# Patient Record
Sex: Male | Born: 1965 | Race: White | Hispanic: No | Marital: Married | Smoking: Former smoker
Health system: Southern US, Community
[De-identification: ages and names within clinical notes are randomized; demographics above are authoritative.]

## PROBLEM LIST (undated history)

## (undated) DIAGNOSIS — E559 Vitamin D deficiency, unspecified: Secondary | ICD-10-CM

## (undated) DIAGNOSIS — E039 Hypothyroidism, unspecified: Secondary | ICD-10-CM

## (undated) DIAGNOSIS — G4733 Obstructive sleep apnea (adult) (pediatric): Secondary | ICD-10-CM

## (undated) DIAGNOSIS — J45909 Unspecified asthma, uncomplicated: Secondary | ICD-10-CM

## (undated) DIAGNOSIS — R7989 Other specified abnormal findings of blood chemistry: Secondary | ICD-10-CM

## (undated) DIAGNOSIS — F32A Depression, unspecified: Secondary | ICD-10-CM

## (undated) DIAGNOSIS — E291 Testicular hypofunction: Secondary | ICD-10-CM

## (undated) DIAGNOSIS — I10 Essential (primary) hypertension: Secondary | ICD-10-CM

## (undated) DIAGNOSIS — F329 Major depressive disorder, single episode, unspecified: Secondary | ICD-10-CM

## (undated) DIAGNOSIS — E119 Type 2 diabetes mellitus without complications: Secondary | ICD-10-CM

## (undated) DIAGNOSIS — Z8719 Personal history of other diseases of the digestive system: Secondary | ICD-10-CM

## (undated) DIAGNOSIS — E669 Obesity, unspecified: Secondary | ICD-10-CM

## (undated) HISTORY — DX: Essential (primary) hypertension: I10

## (undated) HISTORY — DX: Obesity, unspecified: E66.9

## (undated) HISTORY — DX: Hypothyroidism, unspecified: E03.9

## (undated) HISTORY — DX: Personal history of other diseases of the digestive system: Z87.19

## (undated) HISTORY — DX: Vitamin D deficiency, unspecified: E55.9

## (undated) HISTORY — DX: Other specified abnormal findings of blood chemistry: R79.89

## (undated) HISTORY — DX: Depression, unspecified: F32.A

## (undated) HISTORY — PX: COLONOSCOPY: SHX174

## (undated) HISTORY — DX: Type 2 diabetes mellitus without complications: E11.9

## (undated) HISTORY — DX: Major depressive disorder, single episode, unspecified: F32.9

## (undated) HISTORY — PX: POLYPECTOMY: SHX149

## (undated) HISTORY — DX: Testicular hypofunction: E29.1

## (undated) HISTORY — DX: Obstructive sleep apnea (adult) (pediatric): G47.33

---

## 2005-06-20 ENCOUNTER — Other Ambulatory Visit: Payer: Self-pay

## 2005-06-20 ENCOUNTER — Emergency Department: Payer: Self-pay | Admitting: General Practice

## 2005-06-21 ENCOUNTER — Ambulatory Visit: Payer: Self-pay | Admitting: General Practice

## 2006-08-10 ENCOUNTER — Emergency Department: Payer: Self-pay | Admitting: Emergency Medicine

## 2006-08-10 ENCOUNTER — Other Ambulatory Visit: Payer: Self-pay

## 2008-02-24 ENCOUNTER — Emergency Department: Payer: Self-pay | Admitting: Internal Medicine

## 2008-02-24 ENCOUNTER — Other Ambulatory Visit: Payer: Self-pay

## 2008-05-24 ENCOUNTER — Other Ambulatory Visit: Payer: Self-pay

## 2008-05-24 ENCOUNTER — Inpatient Hospital Stay: Payer: Self-pay | Admitting: Internal Medicine

## 2009-03-03 ENCOUNTER — Ambulatory Visit: Payer: Self-pay | Admitting: Internal Medicine

## 2009-05-15 ENCOUNTER — Ambulatory Visit: Payer: Self-pay | Admitting: Family Medicine

## 2009-06-12 ENCOUNTER — Ambulatory Visit: Payer: Self-pay | Admitting: Family Medicine

## 2009-09-20 ENCOUNTER — Emergency Department: Payer: Self-pay | Admitting: Emergency Medicine

## 2010-07-26 ENCOUNTER — Emergency Department: Payer: Self-pay | Admitting: Emergency Medicine

## 2013-04-19 ENCOUNTER — Emergency Department: Payer: Self-pay | Admitting: Unknown Physician Specialty

## 2013-04-19 LAB — URINALYSIS, COMPLETE
Bilirubin,UR: NEGATIVE
Blood: NEGATIVE
Glucose,UR: >=500 mg/dL (ref 0–75)
Leukocyte Esterase: NEGATIVE
Nitrite: NEGATIVE
Ph: 6 (ref 4.5–8.0)
Protein: NEGATIVE
RBC,UR: 1 /HPF (ref 0–5)
Specific Gravity: 1.032 (ref 1.003–1.030)
Squamous Epithelial: NONE SEEN

## 2013-04-19 LAB — COMPREHENSIVE METABOLIC PANEL
Albumin: 3.4 g/dL (ref 3.4–5.0)
BUN: 13 mg/dL (ref 7–18)
Chloride: 102 mmol/L (ref 98–107)
Creatinine: 0.62 mg/dL (ref 0.60–1.30)
Glucose: 358 mg/dL — ABNORMAL HIGH (ref 65–99)
Potassium: 3.3 mmol/L — ABNORMAL LOW (ref 3.5–5.1)
Sodium: 133 mmol/L — ABNORMAL LOW (ref 136–145)

## 2013-04-19 LAB — CBC
HCT: 43.9 % (ref 40.0–52.0)
HGB: 15.2 g/dL (ref 13.0–18.0)
MCH: 29 pg (ref 26.0–34.0)
MCHC: 34.6 g/dL (ref 32.0–36.0)
MCV: 84 fL (ref 80–100)
Platelet: 355 10*3/uL (ref 150–440)
RDW: 13 % (ref 11.5–14.5)

## 2013-04-23 LAB — WOUND CULTURE

## 2013-08-14 ENCOUNTER — Emergency Department: Payer: Self-pay | Admitting: Emergency Medicine

## 2013-08-14 LAB — CBC
HCT: 42.9 % (ref 40.0–52.0)
HGB: 14.9 g/dL (ref 13.0–18.0)
MCH: 28.8 pg (ref 26.0–34.0)
MCV: 83 fL (ref 80–100)
Platelet: 317 10*3/uL (ref 150–440)
RBC: 5.17 10*6/uL (ref 4.40–5.90)
WBC: 13.1 10*3/uL — ABNORMAL HIGH (ref 3.8–10.6)

## 2013-08-14 LAB — BASIC METABOLIC PANEL
Anion Gap: 5 — ABNORMAL LOW (ref 7–16)
BUN: 13 mg/dL (ref 7–18)
Calcium, Total: 9.3 mg/dL (ref 8.5–10.1)
Chloride: 101 mmol/L (ref 98–107)
Creatinine: 0.82 mg/dL (ref 0.60–1.30)
EGFR (African American): >60
EGFR (Non-African Amer.): >60
Glucose: 162 mg/dL — ABNORMAL HIGH (ref 65–99)
Sodium: 136 mmol/L (ref 136–145)

## 2013-08-14 LAB — TROPONIN I
Troponin-I: <0.02 ng/mL
Troponin-I: <0.02 ng/mL

## 2013-08-14 LAB — CK TOTAL AND CKMB (NOT AT ARMC): CK, Total: 213 U/L (ref 35–232)

## 2014-01-10 ENCOUNTER — Inpatient Hospital Stay: Payer: Self-pay | Admitting: Surgery

## 2014-01-10 LAB — CBC WITH DIFFERENTIAL/PLATELET
BASOS ABS: 0.1 10*3/uL (ref 0.0–0.1)
BASOS PCT: 0.6 %
Eosinophil #: 0.1 10*3/uL (ref 0.0–0.7)
Eosinophil %: 0.5 %
HCT: 44.2 % (ref 40.0–52.0)
HGB: 14.5 g/dL (ref 13.0–18.0)
Lymphocyte #: 2.1 10*3/uL (ref 1.0–3.6)
Lymphocyte %: 9.6 %
MCH: 27.8 pg (ref 26.0–34.0)
MCHC: 32.7 g/dL (ref 32.0–36.0)
MCV: 85 fL (ref 80–100)
Monocyte #: 1.8 x10 3/mm — ABNORMAL HIGH (ref 0.2–1.0)
Monocyte %: 8.1 %
Neutrophil #: 18 10*3/uL — ABNORMAL HIGH (ref 1.4–6.5)
Neutrophil %: 81.2 %
Platelet: 299 10*3/uL (ref 150–440)
RBC: 5.2 10*6/uL (ref 4.40–5.90)
RDW: 13.7 % (ref 11.5–14.5)
WBC: 22.2 10*3/uL — ABNORMAL HIGH (ref 3.8–10.6)

## 2014-01-10 LAB — COMPREHENSIVE METABOLIC PANEL
ALBUMIN: 3.4 g/dL (ref 3.4–5.0)
AST: 12 U/L — AB (ref 15–37)
Alkaline Phosphatase: 70 U/L
Anion Gap: 2 — ABNORMAL LOW (ref 7–16)
BILIRUBIN TOTAL: 0.7 mg/dL (ref 0.2–1.0)
BUN: 10 mg/dL (ref 7–18)
CALCIUM: 9 mg/dL (ref 8.5–10.1)
Chloride: 101 mmol/L (ref 98–107)
Co2: 33 mmol/L — ABNORMAL HIGH (ref 21–32)
Creatinine: 0.82 mg/dL (ref 0.60–1.30)
EGFR (African American): >60
GLUCOSE: 127 mg/dL — AB (ref 65–99)
Osmolality: 273 (ref 275–301)
POTASSIUM: 3.3 mmol/L — AB (ref 3.5–5.1)
SGPT (ALT): 22 U/L (ref 12–78)
Sodium: 136 mmol/L (ref 136–145)
Total Protein: 7.8 g/dL (ref 6.4–8.2)

## 2014-01-10 LAB — URINALYSIS, COMPLETE
BILIRUBIN, UR: NEGATIVE
Bacteria: NONE SEEN
GLUCOSE, UR: NEGATIVE mg/dL (ref 0–75)
LEUKOCYTE ESTERASE: NEGATIVE
Nitrite: NEGATIVE
Ph: 5 (ref 4.5–8.0)
RBC,UR: 6 /HPF (ref 0–5)
Specific Gravity: 1.018 (ref 1.003–1.030)
Squamous Epithelial: NONE SEEN

## 2014-01-10 LAB — LIPASE, BLOOD: Lipase: 112 U/L (ref 73–393)

## 2014-01-11 LAB — CBC WITH DIFFERENTIAL/PLATELET
BASOS ABS: 0.1 10*3/uL (ref 0.0–0.1)
Basophil %: 0.5 %
EOS ABS: 0.4 10*3/uL (ref 0.0–0.7)
Eosinophil %: 2.6 %
HCT: 39.1 % — ABNORMAL LOW (ref 40.0–52.0)
HGB: 12.9 g/dL — AB (ref 13.0–18.0)
Lymphocyte #: 2 10*3/uL (ref 1.0–3.6)
Lymphocyte %: 12.5 %
MCH: 28 pg (ref 26.0–34.0)
MCHC: 33 g/dL (ref 32.0–36.0)
MCV: 85 fL (ref 80–100)
MONO ABS: 1.5 x10 3/mm — AB (ref 0.2–1.0)
MONOS PCT: 9.5 %
NEUTROS PCT: 74.9 %
Neutrophil #: 11.6 10*3/uL — ABNORMAL HIGH (ref 1.4–6.5)
PLATELETS: 244 10*3/uL (ref 150–440)
RBC: 4.6 10*6/uL (ref 4.40–5.90)
RDW: 13.2 % (ref 11.5–14.5)
WBC: 15.6 10*3/uL — ABNORMAL HIGH (ref 3.8–10.6)

## 2014-01-12 LAB — CBC WITH DIFFERENTIAL/PLATELET
BASOS ABS: 0.1 10*3/uL (ref 0.0–0.1)
BASOS PCT: 0.6 %
Eosinophil #: 0.5 10*3/uL (ref 0.0–0.7)
Eosinophil %: 3.9 %
HCT: 37.7 % — ABNORMAL LOW (ref 40.0–52.0)
HGB: 12.7 g/dL — ABNORMAL LOW (ref 13.0–18.0)
LYMPHS PCT: 10.9 %
Lymphocyte #: 1.5 10*3/uL (ref 1.0–3.6)
MCH: 28.4 pg (ref 26.0–34.0)
MCHC: 33.5 g/dL (ref 32.0–36.0)
MCV: 85 fL (ref 80–100)
MONO ABS: 1 x10 3/mm (ref 0.2–1.0)
MONOS PCT: 7.3 %
NEUTROS ABS: 10.6 10*3/uL — AB (ref 1.4–6.5)
NEUTROS PCT: 77.3 %
Platelet: 269 10*3/uL (ref 150–440)
RBC: 4.46 10*6/uL (ref 4.40–5.90)
RDW: 13.4 % (ref 11.5–14.5)
WBC: 13.7 10*3/uL — AB (ref 3.8–10.6)

## 2014-01-13 LAB — CBC WITH DIFFERENTIAL/PLATELET
Basophil #: 0.1 10*3/uL (ref 0.0–0.1)
Basophil %: 0.9 %
EOS PCT: 4.5 %
Eosinophil #: 0.5 10*3/uL (ref 0.0–0.7)
HCT: 41.6 % (ref 40.0–52.0)
HGB: 13.8 g/dL (ref 13.0–18.0)
LYMPHS ABS: 1.5 10*3/uL (ref 1.0–3.6)
Lymphocyte %: 13.1 %
MCH: 27.9 pg (ref 26.0–34.0)
MCHC: 33.1 g/dL (ref 32.0–36.0)
MCV: 84 fL (ref 80–100)
MONOS PCT: 7.2 %
Monocyte #: 0.8 x10 3/mm (ref 0.2–1.0)
NEUTROS PCT: 74.3 %
Neutrophil #: 8.7 10*3/uL — ABNORMAL HIGH (ref 1.4–6.5)
PLATELETS: 331 10*3/uL (ref 150–440)
RBC: 4.94 10*6/uL (ref 4.40–5.90)
RDW: 13 % (ref 11.5–14.5)
WBC: 11.7 10*3/uL — ABNORMAL HIGH (ref 3.8–10.6)

## 2014-03-03 ENCOUNTER — Ambulatory Visit: Payer: Self-pay | Admitting: Gastroenterology

## 2014-03-03 LAB — HM COLONOSCOPY: HM Colonoscopy: NEGATIVE

## 2014-08-02 ENCOUNTER — Ambulatory Visit: Payer: Self-pay | Admitting: Family Medicine

## 2014-08-30 ENCOUNTER — Ambulatory Visit: Payer: Self-pay | Admitting: Family Medicine

## 2014-10-03 ENCOUNTER — Ambulatory Visit: Payer: Self-pay | Admitting: Family Medicine

## 2014-10-30 ENCOUNTER — Ambulatory Visit: Payer: Self-pay | Admitting: Family Medicine

## 2015-04-07 ENCOUNTER — Encounter: Payer: Self-pay | Admitting: *Deleted

## 2015-04-12 ENCOUNTER — Ambulatory Visit: Payer: Self-pay | Admitting: Cardiology

## 2015-04-13 ENCOUNTER — Encounter: Payer: Self-pay | Admitting: *Deleted

## 2015-04-22 NOTE — H&P (Signed)
History of Present Illness 73 yowm who was suffering with constipation and took a laxative 3 days ago and has been having diarrhea and abdominal pain ever since. Denies fever, nausea, and vomiting, but he has been anorexic. Pain began in RLQ but is now suprapubic and bilateral, but still worse on the right.   Past History MO, hypothyroidism, dyslipidemia   Past Med/Surgical Hx:  GERD:   HTN:   Cardiac Cath 2011:   ALLERGIES:  No Known Allergies:   HOME MEDICATIONS: Medication Instructions Status  multi-vitamin 1 tab(s) orally once a day  Active  aspirin 81 mg oral tablet 1 tab(s) orally once a day  Active  amlodipine 5 mg oral tablet 1  orally once a day  Active  lisinopril 20 mg tablet 1 tab(s) orally once a day  Active  Janumet 1000 mg-50 mg oral tablet 1 tab(s) orally 2 times a day Active  Lantus  subcutaneous  Active   Family and Social History:  Family History Non-Contributory   Social History negative tobacco, negative ETOH, married, 2 kids, [employer]   Review of Systems:  Fever/Chills No   Cough No   Sputum No   Abdominal Pain Yes   Diarrhea Yes   Constipation Yes   Nausea/Vomiting No   SOB/DOE No   Chest Pain No   Dysuria No   Tolerating PT Yes   Tolerating Diet No   Medications/Allergies Reviewed Medications/Allergies reviewed   Physical Exam:  GEN well developed, well nourished, obese   HEENT pink conjunctivae, PERRL, hearing intact to voice, moist oral mucosa, Oropharynx clear   NECK supple  No masses  trachea midline   RESP normal resp effort  clear BS  no use of accessory muscles   CARD regular rate  no murmur  No LE edema  no JVD  no Rub   ABD positive tenderness  obese, moderate rebound tenderness, RLQ > LLQ   LYMPH negative neck   EXTR negative cyanosis/clubbing, negative edema   SKIN normal to palpation, skin turgor good   NEURO cranial nerves intact, follows commands, motor/sensory function  intact   PSYCH alert, A+O to time, place, person, good insight   Lab Results: Hepatic:  12-Jan-15 17:50   Bilirubin, Total 0.7  Alkaline Phosphatase 70 (45-117 NOTE: New Reference Range 11/19/13)  SGPT (ALT) 22  SGOT (AST)  12  Total Protein, Serum 7.8  Albumin, Serum 3.4  Routine Chem:  12-Jan-15 17:50   Lipase 112 (Result(s) reported on 10 Jan 2014 at 06:37PM.)  Glucose, Serum  127  BUN 10  Creatinine (comp) 0.82  Sodium, Serum 136  Potassium, Serum  3.3  Chloride, Serum 101  CO2, Serum  33  Calcium (Total), Serum 9.0  Osmolality (calc) 273  eGFR (African American) >60  eGFR (Non-African American) >60 (eGFR values <75m/min/1.73 m2 may be an indication of chronic kidney disease (CKD). Calculated eGFR is useful in patients with stable renal function. The eGFR calculation will not be reliable in acutely ill patients when serum creatinine is changing rapidly. It is not useful in  patients on dialysis. The eGFR calculation may not be applicable to patients at the low and high extremes of body sizes, pregnant women, and vegetarians.)  Anion Gap  2  Routine UA:  12-Jan-15 17:50   Color (UA) Yellow  Clarity (UA) Clear  Glucose (UA) Negative  Bilirubin (UA) Negative  Ketones (UA) 1+  Specific Gravity (UA) 1.018  Blood (UA) 1+  pH (UA) 5.0  Protein (UA) 30 mg/dL  Nitrite (UA) Negative  Leukocyte Esterase (UA) Negative (Result(s) reported on 10 Jan 2014 at 06:27PM.)  RBC (UA) 6 /HPF  WBC (UA) 2 /HPF  Bacteria (UA) NONE SEEN  Epithelial Cells (UA) NONE SEEN  Mucous (UA) PRESENT (Result(s) reported on 10 Jan 2014 at 06:27PM.)  Routine Hem:  12-Jan-15 17:50   WBC (CBC)  22.2  RBC (CBC) 5.20  Hemoglobin (CBC) 14.5  Hematocrit (CBC) 44.2  Platelet Count (CBC) 299  MCV 85  MCH 27.8  MCHC 32.7  RDW 13.7  Neutrophil % 81.2  Lymphocyte % 9.6  Monocyte % 8.1  Eosinophil % 0.5  Basophil % 0.6  Neutrophil #  18.0  Lymphocyte # 2.1  Monocyte #  1.8  Eosinophil #  0.1  Basophil # 0.1 (Result(s) reported on 10 Jan 2014 at [facility]   Radiology Results: LabUnknown:    12-Jan-15 19:37, CT Abdomen and Pelvis With Contrast  PACS Image  CT:  CT Abdomen and Pelvis With Contrast  REASON FOR EXAM:    (1) rlq pain; (2) rlq pain  COMMENTS:       PROCEDURE: CT  - CT ABDOMEN / PELVIS  W  - Jan 10 2014  7:37PM     CLINICAL DATA:  Right lower quadrant pain    EXAM:  CT ABDOMEN AND PELVIS WITH CONTRAST    TECHNIQUE:  Multidetector CTimaging of the abdomen and pelvis was performed  using the standard protocol following bolus administration of  intravenous contrast.  CONTRAST:  125 cc Isovue 370    COMPARISON:  None.    FINDINGS:  Normal appendix.    Extensive inflammatory processwithin the lower abdomen and upper  pelvis related to the sigmoid colon. There is considerable wall  thickening and edema. A few scattered diverticuli are present. There  is extensive stranding in the adjacent fat. No loculated abscess. No  extraluminal bowel gas. Findings are most consistent with advanced  acute diverticulitis without perforation or abscess formation.    Diffuse hepatic steatosis.  Gallbladder, spleen, pancreas, adrenal glands, and kidneys are  within normal limits.    No abnormal retroperitoneal adenopathy.     IMPRESSION:  Findings are most consistent with severe acute diverticulitis  without abscess or perforation. Malignancy can sometimes have a  similar appearance. Correlate clinically as for the need for  follow-up imaging.    Normal appendix.      Electronically Signed    By: Maryclare Bean M.D.    On: 01/10/2014 19:43         Verified By: Jamas Lav, M.D.,    Assessment/Admission Diagnosis Mild rectosigmoid diverticulitis (no perforation)   Plan Admit, liquid diet, IV ABx, convert to PO ABx and advance to low residue diet   Electronic Signatures: Consuela Mimes (MD)  (Signed 12-Jan-15 20:46)  Authored: CHIEF COMPLAINT and  HISTORY, PAST MEDICAL/SURGIAL HISTORY, ALLERGIES, HOME MEDICATIONS, FAMILY AND SOCIAL HISTORY, REVIEW OF SYSTEMS, PHYSICAL EXAM, LABS, Radiology, ASSESSMENT AND PLAN   Last Updated: 12-Jan-15 20:46 by Consuela Mimes (MD)

## 2015-04-22 NOTE — Discharge Summary (Signed)
PATIENT NAME:  Nicholas Cabrera, Nicholas Cabrera MR#:  [id number] DATE OF BIRTH:  1966/04/10  DATE OF ADMISSION:  01/10/2014 DATE OF DISCHARGE:  01/13/2014  PRINCIPAL DIAGNOSIS: Acute rectosigmoid diverticulitis (without perforation).   OTHER DIAGNOSES: Hypertension, diabetes, gastroesophageal reflux disease, morbid obesity, hypothyroidism, dyslipidemia, status post cardiac catheterization in 2011.   HOSPITAL COURSE: The patient was admitted to the hospital and given IV fluids and IV antibiotics, and his diet was advanced to a low-fiber diet. He was instructed in the same by the dietitian and discharged home on oral antibiotics on January 15th. He was asked to make an appointment to see Dr. Rexene Edison in followup.    ____________________________ Consuela Mimes, MD wfm:gb D: 01/21/2014 22:58:25 ET T: 01/21/2014 23:14:06 ET JOB#: [id number]  cc: Consuela Mimes, MD, <Dictator> Consuela Mimes MD ELECTRONICALLY SIGNED 01/23/2014 19:50

## 2015-05-04 ENCOUNTER — Ambulatory Visit: Payer: Self-pay | Admitting: Endocrinology

## 2015-06-05 ENCOUNTER — Telehealth: Payer: Self-pay | Admitting: Family Medicine

## 2015-06-05 MED ORDER — INSULIN GLARGINE 100 UNIT/ML ~~LOC~~ SOLN: 40.0000 [IU] | Freq: Every day | SUBCUTANEOUS | Status: DC

## 2015-06-05 NOTE — Telephone Encounter (Signed)
noted 

## 2015-06-05 NOTE — Telephone Encounter (Signed)
Pt's wife here, asked for refill for pt Lantus.  [place].

## 2015-06-13 DIAGNOSIS — E669 Obesity, unspecified: Secondary | ICD-10-CM | POA: Insufficient documentation

## 2015-06-13 DIAGNOSIS — F329 Major depressive disorder, single episode, unspecified: Secondary | ICD-10-CM | POA: Insufficient documentation

## 2015-06-13 DIAGNOSIS — E785 Hyperlipidemia, unspecified: Secondary | ICD-10-CM | POA: Insufficient documentation

## 2015-06-13 DIAGNOSIS — IMO0002 Reserved for concepts with insufficient information to code with codable children: Secondary | ICD-10-CM | POA: Insufficient documentation

## 2015-06-13 DIAGNOSIS — E1165 Type 2 diabetes mellitus with hyperglycemia: Secondary | ICD-10-CM | POA: Insufficient documentation

## 2015-06-13 DIAGNOSIS — E039 Hypothyroidism, unspecified: Secondary | ICD-10-CM | POA: Insufficient documentation

## 2015-06-13 DIAGNOSIS — F32A Depression, unspecified: Secondary | ICD-10-CM | POA: Insufficient documentation

## 2015-06-13 DIAGNOSIS — I10 Essential (primary) hypertension: Secondary | ICD-10-CM | POA: Insufficient documentation

## 2015-06-19 ENCOUNTER — Ambulatory Visit: Payer: Self-pay | Admitting: Family Medicine

## 2015-06-19 ENCOUNTER — Telehealth: Payer: Self-pay | Admitting: Family Medicine

## 2015-06-19 NOTE — Telephone Encounter (Signed)
E-Fax xame through for refill: Rx: Blood glucose strp/Blood glucose test strips

## 2015-06-19 NOTE — Telephone Encounter (Signed)
E-Fax came through for refill: Rx: Accu-chek aviva/Blood glucose monitoring kit Rx copy in basket

## 2015-06-19 NOTE — Telephone Encounter (Signed)
Per practice partner chart, we have already filled glucose test strips thru a different agency earlier this year.

## 2015-06-19 NOTE — Telephone Encounter (Signed)
Duplicate request

## 2015-06-19 NOTE — Telephone Encounter (Signed)
E-Fax came through for refill on: Rx: Blood Glucose Test Strips Rx in basket ready for pick up

## 2015-06-29 ENCOUNTER — Ambulatory Visit (INDEPENDENT_AMBULATORY_CARE_PROVIDER_SITE_OTHER): Payer: BC Managed Care – PPO | Admitting: Family Medicine

## 2015-06-29 ENCOUNTER — Encounter: Payer: Self-pay | Admitting: Family Medicine

## 2015-06-29 VITALS — BP 151/99 | HR 92 | Temp 98.8°F | Wt 278.0 lb

## 2015-06-29 DIAGNOSIS — E1165 Type 2 diabetes mellitus with hyperglycemia: Secondary | ICD-10-CM | POA: Diagnosis not present

## 2015-06-29 DIAGNOSIS — E039 Hypothyroidism, unspecified: Secondary | ICD-10-CM | POA: Diagnosis not present

## 2015-06-29 DIAGNOSIS — E669 Obesity, unspecified: Secondary | ICD-10-CM | POA: Diagnosis not present

## 2015-06-29 DIAGNOSIS — H538 Other visual disturbances: Secondary | ICD-10-CM

## 2015-06-29 DIAGNOSIS — I10 Essential (primary) hypertension: Secondary | ICD-10-CM | POA: Diagnosis not present

## 2015-06-29 DIAGNOSIS — E538 Deficiency of other specified B group vitamins: Secondary | ICD-10-CM

## 2015-06-29 DIAGNOSIS — G4733 Obstructive sleep apnea (adult) (pediatric): Secondary | ICD-10-CM | POA: Diagnosis not present

## 2015-06-29 DIAGNOSIS — E785 Hyperlipidemia, unspecified: Secondary | ICD-10-CM | POA: Diagnosis not present

## 2015-06-29 DIAGNOSIS — IMO0002 Reserved for concepts with insufficient information to code with codable children: Secondary | ICD-10-CM

## 2015-06-29 DIAGNOSIS — E559 Vitamin D deficiency, unspecified: Secondary | ICD-10-CM

## 2015-06-29 MED ORDER — CANAGLIFLOZIN 100 MG PO TABS: 100.0000 mg | ORAL_TABLET | Freq: Every day | ORAL | Status: DC

## 2015-06-29 MED ORDER — SITAGLIPTIN PHOS-METFORMIN HCL 50-1000 MG PO TABS: 1.0000 | ORAL_TABLET | Freq: Two times a day (BID) | ORAL | Status: DC

## 2015-06-29 MED ORDER — AMLODIPINE BESYLATE 10 MG PO TABS: 10.0000 mg | ORAL_TABLET | Freq: Every day | ORAL | Status: DC

## 2015-06-29 MED ORDER — CHLORTHALIDONE 25 MG PO TABS: 25.0000 mg | ORAL_TABLET | Freq: Every day | ORAL | Status: DC

## 2015-06-29 MED ORDER — FLUOXETINE HCL 20 MG PO TABS: 20.0000 mg | ORAL_TABLET | Freq: Every day | ORAL | Status: DC

## 2015-06-29 MED ORDER — INSULIN PEN NEEDLE 32G X 4 MM MISC: Status: DC

## 2015-06-29 MED ORDER — METOPROLOL SUCCINATE ER 50 MG PO TB24: 50.0000 mg | ORAL_TABLET | Freq: Every day | ORAL | Status: DC

## 2015-06-29 MED ORDER — LISINOPRIL 40 MG PO TABS: 40.0000 mg | ORAL_TABLET | Freq: Every day | ORAL | Status: DC

## 2015-06-29 MED ORDER — LEVOTHYROXINE SODIUM 75 MCG PO TABS: 75.0000 ug | ORAL_TABLET | Freq: Every day | ORAL | Status: DC

## 2015-06-29 MED ORDER — INSULIN GLARGINE 100 UNIT/ML SOLOSTAR PEN: 40.0000 [IU] | PEN_INJECTOR | Freq: Every day | SUBCUTANEOUS | Status: DC

## 2015-06-29 MED ORDER — INSULIN GLARGINE 100 UNIT/ML ~~LOC~~ SOLN: 40.0000 [IU] | Freq: Every day | SUBCUTANEOUS | Status: DC

## 2015-06-29 NOTE — Assessment & Plan Note (Addendum)
Maintaining, not losing; this is such a huge factor in his health; if he could lose significant weight, the diabetes, sleep apnea, hypertension would likely all improve immensely; check TSH today

## 2015-06-29 NOTE — Assessment & Plan Note (Signed)
Check TSH and adjust if needed 

## 2015-06-29 NOTE — Progress Notes (Signed)
BP 151/99 mmHg  Pulse 92  Temp(Src) 98.8 F (37.1 C)  Wt 278 lb (126.1 kg)  SpO2 96%   Subjective:    Patient ID: Nicholas Cabrera, male    DOB: 11-16-1966, 49 y.o.   MRN: [id number]  HPI: Nicholas Cabrera is a 49 y.o. male  Chief Complaint  Patient presents with  . Diabetes    needs refill on pen needles  . Hypertension   Hypertension Patient has had hypertension for years Checking blood pressure away from here?  yes How often? Varies, over a month since he checked it last Works 12 hours a day, lifestyle is difficult for him to get any rest, hard to eat right, etc. Range (low to high) over last two weeks:  Not checked, previously 148-179 on top, bottom number 88-110 If taking medicines, are you taking them regularly?  NO Siblings / family history: Does high blood pressure run in your family?   YES Salt:  Trying to limit sodium / salt when buying foods at the grocery store?  NO buys what he can afford Do you try to limit added salt when cooking and at the table?  yes Sweets/licorice:  Do you eat a lot of sweets or eat black licorice?  no Saturated fats: Do you eat a lot of foods like bacon, sausage, pepperoni, cheeseburgers, hot dogs, bologna, and cheese?  YES Steroids/Non-steroidals:  Have you had a recent cortisone shot in the last few months?  no  Do you take prednisone or prescription NSAIDs, No, or take OTCS NSAIDs such as ibuprofen, Motrin, Advil, Aleve, or naproxen? no Smoking: Do you smoke?  no Snoring / sleep apnea: Do you snore or have sleep apnea?  YES  If diagnosed with sleep apnea, do you use your machine?  NO Stress: Do you feel like you are under excessive stress or that your stress level affects your blood pressure at times?    YES Stroh's (alcohol): Do you drink alcohol  no  Sudafed (decongestants): Do you use any OTC decongestant products like Allegra-D, Claritin-D, Zyrtec-D, Tylenol Cold and Sinus, etc.?  no  Diabetes mellitus; FSBS usually 160-230; he is  using insulin 40 units once a day right before bedtime; would be willing to see endocrinologist if price was not an option; he has filed for bankruptcy (see below); he admits that he works long hours and does not have time or resources to eat right or be active; he has not seen an eye doctor since 2007  He and I talked about recent issues with compliance; he filed for bankruptcy, and there are significant financial stressors, obviously, which hinder him taking his medicines regularly; he is also under stress from having to come in to the office and have labs and is trying to be cost-conscience  Relevant past medical, surgical, family and social history reviewed and updated as indicated. Interim medical history since our last visit reviewed. Allergies and medications reviewed and updated.  Review of Systems  Eyes:       Some pressure around eyes; blurred vision left eye, last eye exam 2007, Dr. Ellin Mayhew  Per HPI unless specifically indicated above  Current Outpatient Prescriptions on File Prior to Visit  Medication Sig Dispense Refill  . aspirin 81 MG tablet Take 81 mg by mouth daily.    Marland Kitchen BAYER CONTOUR TEST test strip      No current facility-administered medications on file prior to visit.        Objective:  BP 151/99 mmHg  Pulse 92  Temp(Src) 98.8 F (37.1 C)  Wt 278 lb (126.1 kg)  SpO2 96%  Wt Readings from Last 3 Encounters:  06/29/15 278 lb (126.1 kg)  04/12/15 273 lb (123.832 kg)    Physical Exam  Constitutional: He appears well-developed and well-nourished. No distress.  HENT:  Head: Normocephalic and atraumatic.  Nose: No rhinorrhea.  Mouth/Throat: Mucous membranes are normal.  Eyes: EOM are normal. No scleral icterus.  Neck: No JVD present.  Cardiovascular: Normal rate, regular rhythm and normal heart sounds.   No murmur heard. Pulmonary/Chest: Effort normal and breath sounds normal. No respiratory distress. He has no wheezes.  Abdominal: Soft. Bowel sounds are  normal. He exhibits no distension. There is no tenderness.  Musculoskeletal: He exhibits no edema.  Neurological: He is alert. He displays no tremor. He exhibits normal muscle tone.  Skin: Skin is warm and dry. No rash noted. He is not diaphoretic. No cyanosis.  Psychiatric: He has a normal mood and affect. His behavior is normal. Judgment and thought content normal.  Nursing note and vitals reviewed.  Diabetic Foot Form - Detailed   Diabetic Foot Exam - detailed  Diabetic Foot exam was performed with the following findings:  Yes 06/29/2015  3:29 PM  Visual Foot Exam completed.:  Yes  Is there a history of foot ulcer?:  No  Can the patient see the bottom of their feet?:  No  Are the shoes appropriate in style and fit?:  No    Pulse Foot Exam completed.:  Yes  Right Dorsalis Pedis:  Present Left Dorsalis Pedis:  Present  Sensory Foot Exam Completed.:  Yes  Semmes-Weinstein Monofilament Test  R Site 1-Great Toe:  Pos L Site 1-Great Toe:  Pos    Comments:  Intact to monofilament and vibration testing        Assessment & Plan:   Problem List Items Addressed This Visit      Cardiovascular and Mediastinum   Hypertension (Chronic)    Start back on medicines, move 3 pills to lunch time and 1 pill to bedtime      Relevant Medications   lisinopril (PRINIVIL,ZESTRIL) 40 MG tablet   amLODipine (NORVASC) 10 MG tablet   chlorthalidone (HYGROTON) 25 MG tablet   metoprolol succinate (TOPROL-XL) 50 MG 24 hr tablet   Other Relevant Orders   Basic metabolic panel (Completed)   Microalbumin / creatinine urine ratio     Respiratory   Obstructive sleep apnea    I urged the patient to try to get back on his CPAP; untreated sleep apnea can be contributing to other health problems like his hypertension and obesity; I urged him to call around and check with different companies if needed for price shopping; I am here to help, so I will write for any sort of equipment he might need         Endocrine   Hypothyroidism (Chronic)    Check TSH and adjust if needed      Relevant Medications   metoprolol succinate (TOPROL-XL) 50 MG 24 hr tablet   levothyroxine (SYNTHROID, LEVOTHROID) 75 MCG tablet   Other Relevant Orders   TSH (Completed)     Other   Type 2 diabetes mellitus, uncontrolled - Primary (Chronic)    Check A1C; stressed to get in to see eye doctor; check sugars 3x a day, but okay to do just 2x a day for cost if best for patient; continue aspirin; foot exam by MD; continue  insulin      Relevant Medications   lisinopril (PRINIVIL,ZESTRIL) 40 MG tablet   canagliflozin (INVOKANA) 100 MG TABS tablet   sitaGLIPtin-metformin (JANUMET) 50-1000 MG per tablet   Insulin Glargine (LANTUS SOLOSTAR) 100 UNIT/ML Solostar Pen   Other Relevant Orders   Basic metabolic panel (Completed)   Hgb A1c w/o eAG (Completed)   Microalbumin / creatinine urine ratio   Hyperlipidemia (Chronic)    Start low dose atorvastatin, last LDL was 79 and last HDL was 40      Relevant Medications   lisinopril (PRINIVIL,ZESTRIL) 40 MG tablet   amLODipine (NORVASC) 10 MG tablet   chlorthalidone (HYGROTON) 25 MG tablet   metoprolol succinate (TOPROL-XL) 50 MG 24 hr tablet   Obesity (Chronic)    Maintaining, not losing; this is such a huge factor in his health; if he could lose significant weight, the diabetes, sleep apnea, hypertension would likely all improve immensely; check TSH today      Relevant Medications   canagliflozin (INVOKANA) 100 MG TABS tablet   sitaGLIPtin-metformin (JANUMET) 50-1000 MG per tablet   Insulin Glargine (LANTUS SOLOSTAR) 100 UNIT/ML Solostar Pen    Other Visit Diagnoses    Blurred vision        stressed the absolute importance of having him see his eye doctor right away; not only diabetes but HTN could be causing damage to blood vessels in back of eye      He had a previous phone conversation with staff in May and was going to be discharged from the practice; the  letter never went out, and now that I appreciate where he is coming from, I told him I hear his side of things, and explained our side of things as well; his will NOT be terminated I told him; we came to an understanding, with me realizing the financial constraints, time constraints, and his limited perceived ability to eat right and find time to exercise; he understands my desire to get his numbers to goal, monitor his sugars, kidneys, liver, have him see an eye doctor, etc. in hopes of getting his multiple disease states under control and he'll try to work with me in coming in to appointments; I stressed that I am not asking him to come in for labs or visits just to bring him in, but that we need to work together; once he is in better health, we can conceivably see each other every 6 months, but I can't turn him loose in his current state of health for 6 months Follow up plan: Return in about 3 months (around 09/29/2015) for diabetes.  Orders Placed This Encounter  Procedures  . Basic metabolic panel  . Hgb A1c w/o eAG  . Microalbumin / creatinine urine ratio  . TSH  . Microalbumin, Urine Waived

## 2015-06-29 NOTE — Assessment & Plan Note (Signed)
Start back on medicines, move 3 pills to lunch time and 1 pill to bedtime

## 2015-06-29 NOTE — Patient Instructions (Addendum)
[facility] might be an option for you Your goal blood pressure is less than  130/85. Try to follow the DASH guidelines (DASH stands for Dietary Approaches to Stop Hypertension) Try to limit the sodium in your diet.  Ideally, consume less than 1.5 grams (less than 1,500mg ) per day. Do not add salt when cooking or at the table.  Check the sodium amount on labels when shopping, and choose items lower in sodium when given a choice. Avoid or limit foods that already contain a lot of sodium. Eat a diet rich in fruits and vegetables and whole grains. Do call Dr. Ellin Mayhew and schedule an appointment to see him just as soon as you can Do NOT drive if your vision is impaired or you have trouble seeing  Take the chlorthalidone in the mornings, late morning or around lunch time The other medicines you can either take late morning or lunch or the evening, but make sure you take each type of medicine at the same time of day  Return for follow-up in 3 months We are here to help you, so please contact me if there is anything we can do for you

## 2015-06-29 NOTE — Assessment & Plan Note (Signed)
Check A1C; stressed to get in to see eye doctor; check sugars 3x a day, but okay to do just 2x a day for cost if best for patient; continue aspirin; foot exam by MD; continue insulin

## 2015-06-29 NOTE — Assessment & Plan Note (Signed)
Start low dose atorvastatin, last LDL was 79 and last HDL was 40

## 2015-06-30 LAB — MICROALBUMIN, URINE WAIVED
CREATININE, URINE WAIVED: 200 mg/dL (ref 10–300)
MICROALB, UR WAIVED: 30 mg/L — AB (ref 0–19)
Microalb/Creat Ratio: <30 mg/g (ref <30)

## 2015-06-30 LAB — BASIC METABOLIC PANEL
BUN/Creatinine Ratio: 14 (ref 9–20)
BUN: 8 mg/dL (ref 6–24)
CO2: 25 mmol/L (ref 18–29)
CREATININE: 0.58 mg/dL — AB (ref 0.76–1.27)
Calcium: 9.2 mg/dL (ref 8.7–10.2)
Chloride: 98 mmol/L (ref 97–108)
GFR, EST AFRICAN AMERICAN: 140 mL/min/{1.73_m2} (ref >59)
GFR, EST NON AFRICAN AMERICAN: 121 mL/min/{1.73_m2} (ref >59)
Glucose: 233 mg/dL — ABNORMAL HIGH (ref 65–99)
POTASSIUM: 4 mmol/L (ref 3.5–5.2)
Sodium: 140 mmol/L (ref 134–144)

## 2015-06-30 LAB — HGB A1C W/O EAG: HEMOGLOBIN A1C: 8.4 % — AB (ref 4.8–5.6)

## 2015-06-30 LAB — TSH: TSH: 2.91 u[IU]/mL (ref 0.450–4.500)

## 2015-07-01 DIAGNOSIS — G4733 Obstructive sleep apnea (adult) (pediatric): Secondary | ICD-10-CM | POA: Insufficient documentation

## 2015-07-01 NOTE — Assessment & Plan Note (Signed)
I urged the patient to try to get back on his CPAP; untreated sleep apnea can be contributing to other health problems like his hypertension and obesity; I urged him to call around and check with different companies if needed for price shopping; I am here to help, so I will write for any sort of equipment he might need

## 2015-07-07 ENCOUNTER — Other Ambulatory Visit: Payer: Self-pay | Admitting: Family Medicine

## 2015-07-07 MED ORDER — BAYER CONTOUR TEST VI STRP: ORAL_STRIP | Status: DC

## 2015-07-07 NOTE — Telephone Encounter (Signed)
I spoke with pharmacy, they have been providing his glucose test strips for several years now. Per practice partner, you last wrote a 3 month supply back in April. He does need a refill on test strips.

## 2015-07-07 NOTE — Telephone Encounter (Signed)
That's fine to refill; I entered the sig; different pharmacy listed, so I'm printing to be manually faxed

## 2015-07-07 NOTE — Telephone Encounter (Signed)
rx faxed

## 2015-07-07 NOTE — Telephone Encounter (Signed)
[facility] called regarding a denial to Rx BAYER CONTOUR TEST test strip And want to know what was the reason and would like to talk to Dr. Sanda Klein, there number is [phone]

## 2015-08-11 ENCOUNTER — Encounter: Payer: Self-pay | Admitting: Emergency Medicine

## 2015-08-11 DIAGNOSIS — E860 Dehydration: Secondary | ICD-10-CM | POA: Insufficient documentation

## 2015-08-11 DIAGNOSIS — R110 Nausea: Secondary | ICD-10-CM | POA: Diagnosis not present

## 2015-08-11 DIAGNOSIS — E291 Testicular hypofunction: Secondary | ICD-10-CM | POA: Insufficient documentation

## 2015-08-11 DIAGNOSIS — K5730 Diverticulosis of large intestine without perforation or abscess without bleeding: Secondary | ICD-10-CM | POA: Diagnosis not present

## 2015-08-11 DIAGNOSIS — E559 Vitamin D deficiency, unspecified: Secondary | ICD-10-CM | POA: Insufficient documentation

## 2015-08-11 DIAGNOSIS — A419 Sepsis, unspecified organism: Secondary | ICD-10-CM | POA: Diagnosis not present

## 2015-08-11 DIAGNOSIS — I10 Essential (primary) hypertension: Secondary | ICD-10-CM | POA: Insufficient documentation

## 2015-08-11 DIAGNOSIS — F329 Major depressive disorder, single episode, unspecified: Secondary | ICD-10-CM | POA: Diagnosis not present

## 2015-08-11 DIAGNOSIS — Z9889 Other specified postprocedural states: Secondary | ICD-10-CM | POA: Insufficient documentation

## 2015-08-11 DIAGNOSIS — R1030 Lower abdominal pain, unspecified: Secondary | ICD-10-CM | POA: Insufficient documentation

## 2015-08-11 DIAGNOSIS — K5732 Diverticulitis of large intestine without perforation or abscess without bleeding: Principal | ICD-10-CM | POA: Insufficient documentation

## 2015-08-11 DIAGNOSIS — Z87891 Personal history of nicotine dependence: Secondary | ICD-10-CM | POA: Diagnosis not present

## 2015-08-11 DIAGNOSIS — E669 Obesity, unspecified: Secondary | ICD-10-CM | POA: Insufficient documentation

## 2015-08-11 DIAGNOSIS — N200 Calculus of kidney: Secondary | ICD-10-CM | POA: Diagnosis not present

## 2015-08-11 DIAGNOSIS — E039 Hypothyroidism, unspecified: Secondary | ICD-10-CM | POA: Diagnosis not present

## 2015-08-11 DIAGNOSIS — G4730 Sleep apnea, unspecified: Secondary | ICD-10-CM | POA: Diagnosis not present

## 2015-08-11 DIAGNOSIS — G4733 Obstructive sleep apnea (adult) (pediatric): Secondary | ICD-10-CM | POA: Diagnosis not present

## 2015-08-11 DIAGNOSIS — J45909 Unspecified asthma, uncomplicated: Secondary | ICD-10-CM | POA: Diagnosis not present

## 2015-08-11 DIAGNOSIS — R1032 Left lower quadrant pain: Secondary | ICD-10-CM | POA: Diagnosis present

## 2015-08-11 DIAGNOSIS — E119 Type 2 diabetes mellitus without complications: Secondary | ICD-10-CM | POA: Insufficient documentation

## 2015-08-11 LAB — COMPREHENSIVE METABOLIC PANEL
ALBUMIN: 4 g/dL (ref 3.5–5.0)
ALK PHOS: 52 U/L (ref 38–126)
ALT: 25 U/L (ref 17–63)
ANION GAP: 9 (ref 5–15)
AST: 26 U/L (ref 15–41)
BUN: 8 mg/dL (ref 6–20)
CALCIUM: 9.2 mg/dL (ref 8.9–10.3)
CO2: 28 mmol/L (ref 22–32)
CREATININE: 0.63 mg/dL (ref 0.61–1.24)
Chloride: 101 mmol/L (ref 101–111)
GFR calc Af Amer: >60 mL/min (ref >60)
GFR calc non Af Amer: >60 mL/min (ref >60)
Glucose, Bld: 178 mg/dL — ABNORMAL HIGH (ref 65–99)
POTASSIUM: 3.5 mmol/L (ref 3.5–5.1)
Sodium: 138 mmol/L (ref 135–145)
TOTAL PROTEIN: 7.3 g/dL (ref 6.5–8.1)
Total Bilirubin: 0.7 mg/dL (ref 0.3–1.2)

## 2015-08-11 LAB — CBC
HCT: 46.2 % (ref 40.0–52.0)
Hemoglobin: 15.6 g/dL (ref 13.0–18.0)
MCH: 28.5 pg (ref 26.0–34.0)
MCHC: 33.8 g/dL (ref 32.0–36.0)
MCV: 84.5 fL (ref 80.0–100.0)
PLATELETS: 259 10*3/uL (ref 150–440)
RBC: 5.46 MIL/uL (ref 4.40–5.90)
RDW: 13.5 % (ref 11.5–14.5)
WBC: 17.2 10*3/uL — AB (ref 3.8–10.6)

## 2015-08-11 LAB — GLUCOSE, CAPILLARY: GLUCOSE-CAPILLARY: 166 mg/dL — AB (ref 65–99)

## 2015-08-11 LAB — LIPASE, BLOOD: Lipase: 30 U/L (ref 22–51)

## 2015-08-11 NOTE — ED Notes (Signed)
Patient states that he developed lower abd pain and pressure around 10:00am. Patient states that he has not been able to urinate since 08:00am. Patient bladder scan 77ml. Patient states that he has a history of diverticulitis. Patient states that he had some nausea earlier today but denies at this time. Patient reports poor po intake today.

## 2015-08-12 ENCOUNTER — Emergency Department: Payer: BC Managed Care – PPO

## 2015-08-12 ENCOUNTER — Observation Stay
Admission: EM | Admit: 2015-08-12 | Discharge: 2015-08-13 | Disposition: A | Discharge status: Home / Self Care | Payer: BC Managed Care – PPO | Attending: Internal Medicine | Admitting: Internal Medicine

## 2015-08-12 DIAGNOSIS — K5792 Diverticulitis of intestine, part unspecified, without perforation or abscess without bleeding: Secondary | ICD-10-CM | POA: Diagnosis present

## 2015-08-12 DIAGNOSIS — R1032 Left lower quadrant pain: Secondary | ICD-10-CM

## 2015-08-12 DIAGNOSIS — K5732 Diverticulitis of large intestine without perforation or abscess without bleeding: Secondary | ICD-10-CM

## 2015-08-12 HISTORY — DX: Unspecified asthma, uncomplicated: J45.909

## 2015-08-12 LAB — URINALYSIS COMPLETE WITH MICROSCOPIC (ARMC ONLY)
Bilirubin Urine: NEGATIVE
Glucose, UA: NEGATIVE mg/dL
Ketones, ur: NEGATIVE mg/dL
Leukocytes, UA: NEGATIVE
NITRITE: NEGATIVE
Protein, ur: 30 mg/dL — AB
Specific Gravity, Urine: 1.027 (ref 1.005–1.030)
pH: 5 (ref 5.0–8.0)

## 2015-08-12 LAB — GLUCOSE, CAPILLARY
Glucose-Capillary: 180 mg/dL — ABNORMAL HIGH (ref 65–99)
Glucose-Capillary: 160 mg/dL — ABNORMAL HIGH (ref 65–99)
Glucose-Capillary: 176 mg/dL — ABNORMAL HIGH (ref 65–99)

## 2015-08-12 LAB — TSH: TSH: 5.351 u[IU]/mL — ABNORMAL HIGH (ref 0.350–4.500)

## 2015-08-12 LAB — HEMOGLOBIN A1C: Hgb A1c MFr Bld: 7.9 % — ABNORMAL HIGH (ref 4.0–6.0)

## 2015-08-12 MED ORDER — METRONIDAZOLE 500 MG PO TABS
500.0000 mg | ORAL_TABLET | Freq: Three times a day (TID) | ORAL | Status: DC
  Administered 2015-08-12 – 2015-08-13 (×3): 500 mg via ORAL
  Filled 2015-08-12 (×3): qty 1

## 2015-08-12 MED ORDER — ASPIRIN 81 MG PO TABS: 81.0000 mg | ORAL_TABLET | Freq: Every day | ORAL | Status: DC

## 2015-08-12 MED ORDER — ASPIRIN EC 81 MG PO TBEC
81.0000 mg | DELAYED_RELEASE_TABLET | Freq: Every day | ORAL | Status: DC
  Administered 2015-08-12 – 2015-08-13 (×2): 81 mg via ORAL
  Filled 2015-08-12 (×2): qty 1

## 2015-08-12 MED ORDER — VITAMIN B-12 1000 MCG PO TABS
1500.0000 ug | ORAL_TABLET | Freq: Every day | ORAL | Status: DC
  Administered 2015-08-12 – 2015-08-13 (×2): 1500 ug via ORAL
  Filled 2015-08-12 (×2): qty 1

## 2015-08-12 MED ORDER — VITAMIN D 50 MCG (2000 UT) PO CAPS: 2000.0000 [IU] | ORAL_CAPSULE | Freq: Every day | ORAL | Status: DC

## 2015-08-12 MED ORDER — FLUOXETINE HCL 20 MG PO TABS
20.0000 mg | ORAL_TABLET | Freq: Every day | ORAL | Status: DC
  Administered 2015-08-12 – 2015-08-13 (×2): 20 mg via ORAL
  Filled 2015-08-12 (×4): qty 1

## 2015-08-12 MED ORDER — CHLORTHALIDONE 25 MG PO TABS
25.0000 mg | ORAL_TABLET | Freq: Every day | ORAL | Status: DC
  Administered 2015-08-12 – 2015-08-13 (×2): 25 mg via ORAL
  Filled 2015-08-12 (×2): qty 1

## 2015-08-12 MED ORDER — DOCUSATE SODIUM 100 MG PO CAPS
100.0000 mg | ORAL_CAPSULE | Freq: Two times a day (BID) | ORAL | Status: DC
  Administered 2015-08-12 – 2015-08-13 (×3): 100 mg via ORAL
  Filled 2015-08-12 (×3): qty 1

## 2015-08-12 MED ORDER — MORPHINE SULFATE 2 MG/ML IJ SOLN: 2.0000 mg | INTRAMUSCULAR | Status: DC | PRN

## 2015-08-12 MED ORDER — ACETAMINOPHEN 650 MG RE SUPP: 650.0000 mg | Freq: Four times a day (QID) | RECTAL | Status: DC | PRN

## 2015-08-12 MED ORDER — SODIUM CHLORIDE 0.9 % IV SOLN
INTRAVENOUS | Status: DC
  Administered 2015-08-12 – 2015-08-13 (×3): via INTRAVENOUS

## 2015-08-12 MED ORDER — ACETAMINOPHEN 325 MG PO TABS
650.0000 mg | ORAL_TABLET | Freq: Four times a day (QID) | ORAL | Status: DC | PRN
  Administered 2015-08-13: 650 mg via ORAL
  Filled 2015-08-12: qty 2

## 2015-08-12 MED ORDER — HEPARIN SODIUM (PORCINE) 5000 UNIT/ML IJ SOLN
5000.0000 [IU] | Freq: Three times a day (TID) | INTRAMUSCULAR | Status: DC
  Administered 2015-08-12 – 2015-08-13 (×4): 5000 [IU] via SUBCUTANEOUS
  Filled 2015-08-12 (×4): qty 1

## 2015-08-12 MED ORDER — IOHEXOL 240 MG/ML SOLN
25.0000 mL | Freq: Once | INTRAMUSCULAR | Status: AC | PRN
  Administered 2015-08-12: 25 mL via ORAL

## 2015-08-12 MED ORDER — LEVOTHYROXINE SODIUM 75 MCG PO TABS
75.0000 ug | ORAL_TABLET | Freq: Every day | ORAL | Status: DC
  Administered 2015-08-13: 75 ug via ORAL
  Filled 2015-08-12: qty 1

## 2015-08-12 MED ORDER — INSULIN GLARGINE 100 UNIT/ML ~~LOC~~ SOLN
20.0000 [IU] | Freq: Every day | SUBCUTANEOUS | Status: DC
  Administered 2015-08-12: 20 [IU] via SUBCUTANEOUS
  Filled 2015-08-12 (×2): qty 0.2

## 2015-08-12 MED ORDER — SODIUM CHLORIDE 0.9 % IV BOLUS (SEPSIS)
1000.0000 mL | Freq: Once | INTRAVENOUS | Status: AC
  Administered 2015-08-12: 1000 mL via INTRAVENOUS

## 2015-08-12 MED ORDER — ONDANSETRON HCL 4 MG PO TABS: 4.0000 mg | ORAL_TABLET | Freq: Four times a day (QID) | ORAL | Status: DC | PRN

## 2015-08-12 MED ORDER — ONDANSETRON HCL 4 MG/2ML IJ SOLN: 4.0000 mg | Freq: Four times a day (QID) | INTRAMUSCULAR | Status: DC | PRN

## 2015-08-12 MED ORDER — LISINOPRIL 20 MG PO TABS
40.0000 mg | ORAL_TABLET | Freq: Every day | ORAL | Status: DC
  Administered 2015-08-12 – 2015-08-13 (×2): 40 mg via ORAL
  Filled 2015-08-12 (×2): qty 2

## 2015-08-12 MED ORDER — METRONIDAZOLE IN NACL 5-0.79 MG/ML-% IV SOLN
500.0000 mg | Freq: Once | INTRAVENOUS | Status: AC
  Administered 2015-08-12: 500 mg via INTRAVENOUS
  Filled 2015-08-12: qty 100

## 2015-08-12 MED ORDER — CIPROFLOXACIN IN D5W 400 MG/200ML IV SOLN
400.0000 mg | Freq: Two times a day (BID) | INTRAVENOUS | Status: DC
  Administered 2015-08-12 – 2015-08-13 (×2): 400 mg via INTRAVENOUS
  Filled 2015-08-12 (×4): qty 200

## 2015-08-12 MED ORDER — SODIUM CHLORIDE 0.9 % IJ SOLN: 3.0000 mL | Freq: Two times a day (BID) | INTRAMUSCULAR | Status: DC

## 2015-08-12 MED ORDER — METOPROLOL SUCCINATE ER 50 MG PO TB24
50.0000 mg | ORAL_TABLET | Freq: Every day | ORAL | Status: DC
  Administered 2015-08-13: 50 mg via ORAL
  Filled 2015-08-12: qty 1

## 2015-08-12 MED ORDER — VITAMIN D 1000 UNITS PO TABS
2000.0000 [IU] | ORAL_TABLET | Freq: Every day | ORAL | Status: DC
  Administered 2015-08-12 – 2015-08-13 (×2): 2000 [IU] via ORAL
  Filled 2015-08-12 (×2): qty 2

## 2015-08-12 MED ORDER — INSULIN ASPART 100 UNIT/ML ~~LOC~~ SOLN
0.0000 [IU] | Freq: Three times a day (TID) | SUBCUTANEOUS | Status: DC
  Administered 2015-08-12 – 2015-08-13 (×4): 3 [IU] via SUBCUTANEOUS
  Filled 2015-08-12 (×4): qty 3

## 2015-08-12 MED ORDER — CIPROFLOXACIN IN D5W 400 MG/200ML IV SOLN
400.0000 mg | Freq: Once | INTRAVENOUS | Status: AC
  Administered 2015-08-12: 400 mg via INTRAVENOUS
  Filled 2015-08-12: qty 200

## 2015-08-12 MED ORDER — IOHEXOL 300 MG/ML  SOLN
100.0000 mL | Freq: Once | INTRAMUSCULAR | Status: AC | PRN
  Administered 2015-08-12: 100 mL via INTRAVENOUS

## 2015-08-12 MED ORDER — INSULIN ASPART 100 UNIT/ML ~~LOC~~ SOLN: 0.0000 [IU] | Freq: Every day | SUBCUTANEOUS | Status: DC

## 2015-08-12 MED ORDER — AMLODIPINE BESYLATE 10 MG PO TABS
10.0000 mg | ORAL_TABLET | Freq: Every day | ORAL | Status: DC
  Administered 2015-08-12 – 2015-08-13 (×2): 10 mg via ORAL
  Filled 2015-08-12 (×2): qty 1

## 2015-08-12 NOTE — ED Provider Notes (Addendum)
[facility] Emergency Department Provider Note  ____________________________________________  Time seen: 3:17 AM  I have reviewed the triage vital signs and the nursing notes.   HISTORY  Chief Complaint Abdominal Pain     HPI Nicholas Cabrera is a 49 y.o. male with a history of diverticulitis who began having pain in his lower abdomen Friday morning. He reports he was having difficulty urinating. He has episodes of pain that lasts for a short periods, generally tender 15 seconds. They are relieved after he has flatulence.  He reports he has had some nausea and decreased appetite through the day.  The last time he had diverticulitis was last year. He needed to be hospitalized. He reports his pain was worse at that time. He is concerned his condition may worsen again to that level.   Past Medical History  Diagnosis Date  . Vitamin D deficiency   . Low testosterone   . OSA (obstructive sleep apnea)   . Hypothyroidism   . Obesity   . Testicular hypofunction   . Hx of diverticulitis of colon   . Diabetes   . Hypertension   . Hypothyroidism   . Obesity   . Depression   . Asthma     Patient Active Problem List   Diagnosis Date Noted  . Obstructive sleep apnea 07/01/2015  . Type 2 diabetes mellitus, uncontrolled   . Hypertension   . Hyperlipidemia   . Hypothyroidism   . Obesity   . Depression     Past Surgical History  Procedure Laterality Date  . Colonoscopy    . Polypectomy      Current Outpatient Rx  Name  Route  Sig  Dispense  Refill  . aspirin 81 MG tablet   Oral   Take 81 mg by mouth daily.         Marland Kitchen BAYER CONTOUR TEST test strip      Check FSBS three times per day; dx 11.65   100 each   11     Dispense as written.   . canagliflozin (INVOKANA) 100 MG TABS tablet   Oral   Take 1 tablet (100 mg total) by mouth daily. Preferred by insurance; this replaces Farxiga   30 tablet   6   . chlorthalidone (HYGROTON) 25 MG  tablet   Oral   Take 1 tablet (25 mg total) by mouth daily.   30 tablet   6   . Cholecalciferol (VITAMIN D) 2000 UNITS CAPS   Oral   Take 2,000 Units by mouth daily.         . dapagliflozin propanediol (FARXIGA) 10 MG TABS tablet   Oral   Take 10 mg by mouth daily.         Marland Kitchen FLUoxetine (PROZAC) 20 MG tablet   Oral   Take 1 tablet (20 mg total) by mouth daily.   30 tablet   6   . Insulin Pen Needle 32G X 4 MM MISC      One needle per injection; for use with Lantus SoloStar.   100 each   3   . levothyroxine (SYNTHROID, LEVOTHROID) 75 MCG tablet   Oral   Take 1 tablet (75 mcg total) by mouth daily before breakfast.   30 tablet   6   . lisinopril (PRINIVIL,ZESTRIL) 40 MG tablet   Oral   Take 1 tablet (40 mg total) by mouth daily.   30 tablet   6   . metoprolol succinate (TOPROL-XL) 50  MG 24 hr tablet   Oral   Take 1 tablet (50 mg total) by mouth daily. Take with or immediately following a meal.   30 tablet   6   . sitaGLIPtin-metformin (JANUMET) 50-1000 MG per tablet   Oral   Take 1 tablet by mouth 2 (two) times daily with a meal.   60 tablet   6   . vitamin B-12 (CYANOCOBALAMIN) 1000 MCG tablet   Oral   Take 1,500 mcg by mouth daily.         Marland Kitchen amLODipine (NORVASC) 10 MG tablet   Oral   Take 1 tablet (10 mg total) by mouth daily.   30 tablet   6   . Insulin Glargine (LANTUS SOLOSTAR) 100 UNIT/ML Solostar Pen   Subcutaneous   Inject 40 Units into the skin daily at 10 pm.   15 mL   6     Allergies Tussionex pennkinetic er  Family History  Problem Relation Age of Onset  . Hypertension Mother   . Stroke Father   . Hypertension Father   . Diabetes Father   . Glaucoma Father   . Heart disease Maternal Grandfather   . Stroke Paternal Grandmother   . Diabetes Paternal Grandmother   . Diabetes Paternal Grandfather   . Lung disease Paternal Grandfather   . Cancer Maternal Uncle     lung    Social History Social History  Substance Use  Topics  . Smoking status: Former Smoker    Quit date: 05/17/2006  . Smokeless tobacco: Never Used  . Alcohol Use: No    Review of Systems  Constitutional: Negative for fever. ENT: Negative for sore throat. Cardiovascular: Negative for chest pain. Respiratory: Negative for shortness of breath. Gastrointestinal: abdominal pain with some nausea and anorexia. See history of present illness Genitourinary: Negative for dysuria. Musculoskeletal: No myalgias or injuries. Skin: Negative for rash. Neurological: Negative for headaches   10-point ROS otherwise negative.  ____________________________________________   PHYSICAL EXAM:  VITAL SIGNS: ED Triage Vitals  Enc Vitals Group     BP 08/11/15 2233 159/97 mmHg     Pulse Rate 08/11/15 2233 115     Resp 08/11/15 2233 18     Temp 08/11/15 2233 98.3 F (36.8 C)     Temp Source 08/11/15 2233 Oral     SpO2 08/11/15 2233 93 %     Weight 08/11/15 2233 279 lb (126.554 kg)     Height 08/11/15 2233 5\' 9"  (1.753 m)     Head Cir --      Peak Flow --      Pain Score 08/11/15 2233 2     Pain Loc --      Pain Edu? --      Excl. in [place]? --     Constitutional:  Alert and oriented. Well appearing and in no distress. ENT   Head: Normocephalic and atraumatic.   Nose: No congestion/rhinnorhea.   Mouth/Throat: Mucous membranes are moist. Cardiovascular: Normal rate, regular rhythm, no murmur noted Respiratory:  Normal respiratory effort, no tachypnea.    Breath sounds are clear and equal bilaterally.  Gastrointestinal: Soft. Notable mild to moderate tenderness in the lower abdomen, primarily just left of center. Normal bowel sounds. No distention.  Back: No muscle spasm, no tenderness, no CVA tenderness. Musculoskeletal: No deformity noted. Nontender with normal range of motion in all extremities.  No noted edema. Neurologic:  Normal speech and language. No gross focal neurologic deficits are appreciated.  Skin:  Skin is warm, dry.  No rash noted. Psychiatric: Mood and affect are normal. Speech and behavior are normal.  ____________________________________________    LABS (pertinent positives/negatives)  Labs Reviewed  COMPREHENSIVE METABOLIC PANEL - Abnormal; Notable for the following:    Glucose, Bld 178 (*)    All other components within normal limits  CBC - Abnormal; Notable for the following:    WBC 17.2 (*)    All other components within normal limits  URINALYSIS COMPLETEWITH MICROSCOPIC ([facility] ONLY) - Abnormal; Notable for the following:    Color, Urine YELLOW (*)    APPearance HAZY (*)    Hgb urine dipstick 1+ (*)    Protein, ur 30 (*)    Bacteria, UA RARE (*)    Squamous Epithelial / LPF 0-5 (*)    All other components within normal limits  GLUCOSE, CAPILLARY - Abnormal; Notable for the following:    Glucose-Capillary 166 (*)    All other components within normal limits  LIPASE, BLOOD     ____________________________________________   RADIOLOGY  CT abdomen and pelvis:  IMPRESSION: 1. Acute diverticulitis at the proximal sigmoid colon, with diffuse wall thickening, inflamed diverticula and a small amount of associated free fluid. No definite evidence of perforation or abscess formation at this time. 2. Mild calcification along the distal abdominal aorta and its branches. 3. 3 mm nonobstructing stone at the lower pole of the right kidney.   ____________________________________________  INITIAL IMPRESSION / ASSESSMENT AND PLAN / ED COURSE  Pertinent labs & imaging results that were available during my care of the patient were reviewed by me and considered in my medical decision making (see chart for details).  49 year old male with history of diverticulitis with new pain and symptoms over current diverticulitis. I have discussed getting a CT or not. The patient and I agree that a CT is appropriate given his elevated white blood cell count of 17.4 thousand.  I will begin antibiotic  treatment due to the elevated white blood cell count and the patient's history and current symptoms.  ----------------------------------------- 6:26 AM on 08/12/2015 -----------------------------------------  CT scan results have been reviewed. He has acute diverticulitis is suspected. There is no evidence of perforation. I did inform the patient of this. His pain has been under control. We discussed options of admission to the hospital versus discharge home. With the elevated white blood cell count, I think is reasonable and I'm more comfortable with the patient staying in the hospital for a day with ongoing IV antibiotics. The patient has no strong preference. We will discuss this with the internal medicine group.  ----------------------------------------- 6:53 AM on 08/12/2015 -----------------------------------------  This is been discussed with Dr. Marcille Blanco. He will consult and see the patient. If he in the patient preferred discharge home I think this is a reasonable plan. ____________________________________________   FINAL CLINICAL IMPRESSION(S) / ED DIAGNOSES  Final diagnoses:  Diverticulitis of large intestine without perforation or abscess without bleeding  Left lower quadrant pain      Ahmed Prima, MD 08/12/15 0630  Ahmed Prima, MD 08/12/15 [phone]

## 2015-08-12 NOTE — H&P (Signed)
Nicholas Cabrera is an 49 y.o. male.   Chief Complaint: Abdominal pain HPI: She presents emergency department complaining of abdominal pain. The patient states that he has had pain below his belly button for nearly 2 days. She has not had an appetite but he denies nausea or vomiting. He has not had a bowel movement in that time either. He admits that he has had diverticulitis in the past required hospitalization. In the emergency department he was found to have leukocytosis as well as diverticulitis on CT imaging of the abdomen which prompted emergency department staff to call for admission.  Past Medical History  Diagnosis Date  . Vitamin D deficiency   . Low testosterone   . OSA (obstructive sleep apnea)   . Hypothyroidism   . Obesity   . Testicular hypofunction   . Hx of diverticulitis of colon   . Diabetes   . Hypertension   . Hypothyroidism   . Obesity   . Depression   . Asthma     Past Surgical History  Procedure Laterality Date  . Colonoscopy    . Polypectomy      Family History  Problem Relation Age of Onset  . Hypertension Mother   . Stroke Father   . Hypertension Father   . Diabetes Father   . Glaucoma Father   . Heart disease Maternal Grandfather   . Stroke Paternal Grandmother   . Diabetes Paternal Grandmother   . Diabetes Paternal Grandfather   . Lung disease Paternal Grandfather   . Cancer Maternal Uncle     lung   Social History:  reports that he quit smoking about 9 years ago. He has never used smokeless tobacco. He reports that he does not drink alcohol or use illicit drugs.  Allergies:  Allergies  Allergen Reactions  . Tussionex Pennkinetic Er [Hydrocod Polst-Cpm Polst Er] Nausea Only    Prior to Admission medications   Medication Sig Start Date End Date Taking? Authorizing Provider  aspirin 81 MG tablet Take 81 mg by mouth daily.   Yes Historical Provider, MD  BAYER CONTOUR TEST test strip Check FSBS three times per day; dx 11.65 07/07/15  Yes  Arnetha Courser, MD  canagliflozin (INVOKANA) 100 MG TABS tablet Take 1 tablet (100 mg total) by mouth daily. Preferred by insurance; this replaces Farxiga 06/29/15  Yes Arnetha Courser, MD  chlorthalidone (HYGROTON) 25 MG tablet Take 1 tablet (25 mg total) by mouth daily. 06/29/15  Yes Arnetha Courser, MD  Cholecalciferol (VITAMIN D) 2000 UNITS CAPS Take 2,000 Units by mouth daily.   Yes Historical Provider, MD  dapagliflozin propanediol (FARXIGA) 10 MG TABS tablet Take 10 mg by mouth daily.   Yes Historical Provider, MD  FLUoxetine (PROZAC) 20 MG tablet Take 1 tablet (20 mg total) by mouth daily. 06/29/15  Yes Arnetha Courser, MD  Insulin Pen Needle 32G X 4 MM MISC One needle per injection; for use with Lantus SoloStar. 06/29/15  Yes Arnetha Courser, MD  levothyroxine (SYNTHROID, LEVOTHROID) 75 MCG tablet Take 1 tablet (75 mcg total) by mouth daily before breakfast. 06/29/15  Yes Arnetha Courser, MD  lisinopril (PRINIVIL,ZESTRIL) 40 MG tablet Take 1 tablet (40 mg total) by mouth daily. 06/29/15  Yes Arnetha Courser, MD  metoprolol succinate (TOPROL-XL) 50 MG 24 hr tablet Take 1 tablet (50 mg total) by mouth daily. Take with or immediately following a meal. 06/29/15  Yes Arnetha Courser, MD  sitaGLIPtin-metformin (JANUMET) 50-1000 MG per  tablet Take 1 tablet by mouth 2 (two) times daily with a meal. 06/29/15  Yes Arnetha Courser, MD  vitamin B-12 (CYANOCOBALAMIN) 1000 MCG tablet Take 1,500 mcg by mouth daily.   Yes Historical Provider, MD  amLODipine (NORVASC) 10 MG tablet Take 1 tablet (10 mg total) by mouth daily. 06/29/15   Arnetha Courser, MD  Insulin Glargine (LANTUS SOLOSTAR) 100 UNIT/ML Solostar Pen Inject 40 Units into the skin daily at 10 pm. 06/29/15   Arnetha Courser, MD     Results for orders placed or performed during the hospital encounter of 08/12/15 (from the past 48 hour(s))  Glucose, capillary     Status: Abnormal   Collection Time: 08/11/15 10:37 PM  Result Value Ref Range   Glucose-Capillary 166  (H) 65 - 99 mg/dL  Lipase, blood     Status: None   Collection Time: 08/11/15 10:43 PM  Result Value Ref Range   Lipase 30 22 - 51 U/L  Comprehensive metabolic panel     Status: Abnormal   Collection Time: 08/11/15 10:43 PM  Result Value Ref Range   Sodium 138 135 - 145 mmol/L   Potassium 3.5 3.5 - 5.1 mmol/L   Chloride 101 101 - 111 mmol/L   CO2 28 22 - 32 mmol/L   Glucose, Bld 178 (H) 65 - 99 mg/dL   BUN 8 6 - 20 mg/dL   Creatinine, Ser 0.63 0.61 - 1.24 mg/dL   Calcium 9.2 8.9 - 10.3 mg/dL   Total Protein 7.3 6.5 - 8.1 g/dL   Albumin 4.0 3.5 - 5.0 g/dL   AST 26 15 - 41 U/L   ALT 25 17 - 63 U/L   Alkaline Phosphatase 52 38 - 126 U/L   Total Bilirubin 0.7 0.3 - 1.2 mg/dL   GFR calc non Af Amer >60 >60 mL/min   GFR calc Af Amer >60 >60 mL/min    Comment: (NOTE) The eGFR has been calculated using the CKD EPI equation. This calculation has not been validated in all clinical situations. eGFR's persistently <60 mL/min signify possible Chronic Kidney Disease.    Anion gap 9 5 - 15  CBC     Status: Abnormal   Collection Time: 08/11/15 10:43 PM  Result Value Ref Range   WBC 17.2 (H) 3.8 - 10.6 K/uL   RBC 5.46 4.40 - 5.90 MIL/uL   Hemoglobin 15.6 13.0 - 18.0 g/dL   HCT 46.2 40.0 - 52.0 %   MCV 84.5 80.0 - 100.0 fL   MCH 28.5 26.0 - 34.0 pg   MCHC 33.8 32.0 - 36.0 g/dL   RDW 13.5 11.5 - 14.5 %   Platelets 259 150 - 440 K/uL  Urinalysis complete, with microscopic ([facility] only)     Status: Abnormal   Collection Time: 08/12/15  2:43 AM  Result Value Ref Range   Color, Urine YELLOW (A) YELLOW   APPearance HAZY (A) CLEAR   Glucose, UA NEGATIVE NEGATIVE mg/dL   Bilirubin Urine NEGATIVE NEGATIVE   Ketones, ur NEGATIVE NEGATIVE mg/dL   Specific Gravity, Urine 1.027 1.005 - 1.030   Hgb urine dipstick 1+ (A) NEGATIVE   pH 5.0 5.0 - 8.0   Protein, ur 30 (A) NEGATIVE mg/dL   Nitrite NEGATIVE NEGATIVE   Leukocytes, UA NEGATIVE NEGATIVE   RBC / HPF 0-5 0 - 5 RBC/hpf   WBC, UA 0-5 0 -  5 WBC/hpf   Bacteria, UA RARE (A) NONE SEEN   Squamous Epithelial / LPF 0-5 (  A) NONE SEEN   Mucous PRESENT    Ct Abdomen Pelvis W Contrast  08/12/2015   CLINICAL DATA:  Acute onset of lower abdominal pain and pressure. Unable to urinate. Nausea. Initial encounter.  EXAM: CT ABDOMEN AND PELVIS WITH CONTRAST  TECHNIQUE: Multidetector CT imaging of the abdomen and pelvis was performed using the standard protocol following bolus administration of intravenous contrast.  CONTRAST:  163m OMNIPAQUE IOHEXOL 300 MG/ML  SOLN  COMPARISON:  CT of the abdomen and pelvis from 01/10/2014  FINDINGS: The visualized lung bases are clear.  The liver and spleen are unremarkable in appearance. The gallbladder is within normal limits. The pancreas and adrenal glands are unremarkable.  Minimal nonspecific perinephric stranding is noted bilaterally. A 3 mm stone is noted at the lower pole of the right kidney. The kidneys are otherwise unremarkable. There is no evidence of hydronephrosis. No obstructing ureteral stones are seen.  No free fluid is identified. The small bowel is unremarkable in appearance. The stomach is within normal limits. No acute vascular abnormalities are seen. Mild calcification is noted along the distal abdominal aorta and its branches.  The appendix is normal in caliber, without evidence for appendicitis. Scattered diverticulosis is noted along the distal descending and proximal sigmoid colon.  There is diffuse wall thickening along the proximal sigmoid colon, with associated inflamed diverticula and a small amount of associated free fluid. Findings are compatible with acute diverticulitis. There is no definite evidence of perforation or abscess formation.  The bladder is mildly distended and grossly unremarkable. The prostate is borderline normal in size, with scattered calcification. No inguinal lymphadenopathy is seen.  No acute osseous abnormalities are identified.  IMPRESSION: 1. Acute diverticulitis at  the proximal sigmoid colon, with diffuse wall thickening, inflamed diverticula and a small amount of associated free fluid. No definite evidence of perforation or abscess formation at this time. 2. Mild calcification along the distal abdominal aorta and its branches. 3. 3 mm nonobstructing stone at the lower pole of the right kidney.   Electronically Signed   By: JGarald BaldingM.D.   On: 08/12/2015 05:48    Review of Systems  Constitutional: Negative for fever and chills.  HENT: Negative for sore throat and tinnitus.   Eyes: Negative for blurred vision and redness.  Respiratory: Negative for cough and shortness of breath.   Cardiovascular: Negative for chest pain, palpitations, orthopnea and PND.  Gastrointestinal: Positive for abdominal pain. Negative for nausea, vomiting and diarrhea.  Genitourinary: Negative for dysuria, urgency and frequency.  Musculoskeletal: Negative for myalgias and joint pain.  Skin: Negative for rash.       No lesions  Neurological: Negative for speech change, focal weakness and weakness.  Endo/Heme/Allergies: Does not bruise/bleed easily.       No temperature intolerance  Psychiatric/Behavioral: Negative for depression and suicidal ideas.    Blood pressure 144/98, pulse 85, temperature 98.3 F (36.8 C), temperature source Oral, resp. rate 20, height 5' 9"  (1.753 m), weight 126.554 kg (279 lb), SpO2 97 %. Physical Exam  Nursing note and vitals reviewed. Constitutional: He is oriented to person, place, and time. He appears well-developed and well-nourished. No distress.  HENT:  Head: Normocephalic and atraumatic.  Mouth/Throat: No oropharyngeal exudate.  Eyes: Conjunctivae and EOM are normal. Pupils are equal, round, and reactive to light. No scleral icterus.  Neck: Normal range of motion. Neck supple. No JVD present. No tracheal deviation present. No thyromegaly present.  Cardiovascular: Normal rate, regular rhythm, normal heart sounds and  intact distal  pulses.  Exam reveals no gallop and no friction rub.   No murmur heard. Respiratory: Effort normal and breath sounds normal. No respiratory distress.  GI: Soft. Bowel sounds are normal. He exhibits no distension and no mass. There is tenderness. There is no rebound and no guarding.  Genitourinary:  Deferred   Musculoskeletal: Normal range of motion. He exhibits edema (trace).  Lymphadenopathy:    He has no cervical adenopathy.  Neurological: He is alert and oriented to person, place, and time. He has normal reflexes.  Skin: Skin is warm and dry. No rash noted. No erythema.  Psychiatric: He has a normal mood and affect. His behavior is normal. Judgment and thought content normal.     Assessment/Plan This 49 year old Caucasian male with past medical history significant for diabetes and hypothyroidism admitted for diverticulitis and sepsis. 1. Diverticulitis: No perforation seen on CT. Patient was given 1 dose of IV Cipro and oral Flagyl in the emergency department. We will continue these medications and have the surgical service consult if serial abdominal exams reveal need for intervention. 2. Sepsis: Patient's criteria via leukocytosis and tachycardia. The latter may be due to dehydration as the patient admits that he has not had anything to drink nor has he urinated in at least 36 hours. Obtain blood cultures (although patient has been pretreated with antibiotics). We will fluid resuscitate the patient and place him on telemetry. 3. Diabetes mellitus type 2: I have held patient's oral hypoglycemics as well as metformin. We'll continue his basal insulin dosing adjusted for hospital diet and placed the patient on sliding scale insulin. 4. Hypothyroidism: Continue Synthroid 5. Hypertension: Continue chlorthalidone, amlodipine and metoprolol succinate 6. Shortness sleep apnea: Order nocturnal CPAP 7. DVT prophylaxis: Heparin 8. GI prophylaxis: None The patient is a full code. Time spent on  admission orders and patient care approximately 35 minutes.  Harrie Foreman 08/12/2015, 7:26 AM

## 2015-08-12 NOTE — Progress Notes (Signed)
[facility] NAME: Nicholas Cabrera    MR#:  [id number]  DATE OF BIRTH:  [birth year]  SUBJECTIVE:seen at bed side.admitted for diverticulitis.had BM now,feels lower abdominal pain.no nausea.  CHIEF COMPLAINT:   Chief Complaint  Patient presents with  . Abdominal Pain    REVIEW OF SYSTEMS:   ROS CONSTITUTIONAL: No fever, fatigue or weakness.  EYES: No blurred or double vision.  EARS, NOSE, AND THROAT: No tinnitus or ear pain.  RESPIRATORY: No cough, shortness of breath, wheezing or hemoptysis.  CARDIOVASCULAR: No chest pain, orthopnea, edema.  GASTROINTESTINAL: No nausea, vomiting, diarrhea or abdominal pain.  GENITOURINARY: No dysuria, hematuria.  ENDOCRINE: No polyuria, nocturia,  HEMATOLOGY: No anemia, easy bruising or bleeding SKIN: No rash or lesion. MUSCULOSKELETAL: No joint pain or arthritis.   NEUROLOGIC: No tingling, numbness, weakness.  PSYCHIATRY: No anxiety or depression.   DRUG ALLERGIES:   Allergies  Allergen Reactions  . Tussionex Pennkinetic Er [Hydrocod Polst-Cpm Polst Er] Nausea Only    VITALS:  Blood pressure 166/98, pulse 90, temperature 98.2 F (36.8 C), temperature source Oral, resp. rate 17, height 5\' 9"  (1.753 m), weight 126.554 kg (279 lb), SpO2 98 %.  PHYSICAL EXAMINATION:  GENERAL:  49 y.o.-year-old patient lying in the bed with no acute distress.  EYES: Pupils equal, round, reactive to light and accommodation. No scleral icterus. Extraocular muscles intact.  HEENT: Head atraumatic, normocephalic. Oropharynx and nasopharynx clear.  NECK:  Supple, no jugular venous distention. No thyroid enlargement, no tenderness.  LUNGS: Normal breath sounds bilaterally, no wheezing, rales,rhonchi or crepitation. No use of accessory muscles of respiration.  CARDIOVASCULAR: S1, S2 normal. No murmurs, rubs, or gallops.  ABDOMEN: Soft, nontender, nondistended. Bowel sounds present. No organomegaly or mass.   EXTREMITIES: No pedal edema, cyanosis, or clubbing.  NEUROLOGIC: Cranial nerves II through XII are intact. Muscle strength 5/5 in all extremities. Sensation intact. Gait not checked.  PSYCHIATRIC: The patient is alert and oriented x 3.  SKIN: No obvious rash, lesion, or ulcer.    LABORATORY PANEL:   CBC  Recent Labs Lab 08/11/15 2243  WBC 17.2*  HGB 15.6  HCT 46.2  PLT 259   ------------------------------------------------------------------------------------------------------------------  Chemistries   Recent Labs Lab 08/11/15 2243  NA 138  K 3.5  CL 101  CO2 28  GLUCOSE 178*  BUN 8  CREATININE 0.63  CALCIUM 9.2  AST 26  ALT 25  ALKPHOS 52  BILITOT 0.7   ------------------------------------------------------------------------------------------------------------------  Cardiac Enzymes No results for input(s): TROPONINI in the last 168 hours. ------------------------------------------------------------------------------------------------------------------  RADIOLOGY:  Ct Abdomen Pelvis W Contrast  08/12/2015   CLINICAL DATA:  Acute onset of lower abdominal pain and pressure. Unable to urinate. Nausea. Initial encounter.  EXAM: CT ABDOMEN AND PELVIS WITH CONTRAST  TECHNIQUE: Multidetector CT imaging of the abdomen and pelvis was performed using the standard protocol following bolus administration of intravenous contrast.  CONTRAST:  159mL OMNIPAQUE IOHEXOL 300 MG/ML  SOLN  COMPARISON:  CT of the abdomen and pelvis from 01/10/2014  FINDINGS: The visualized lung bases are clear.  The liver and spleen are unremarkable in appearance. The gallbladder is within normal limits. The pancreas and adrenal glands are unremarkable.  Minimal nonspecific perinephric stranding is noted bilaterally. A 3 mm stone is noted at the lower pole of the right kidney. The kidneys are otherwise unremarkable. There is no evidence of hydronephrosis. No obstructing ureteral stones are seen.  No free  fluid is identified. The  small bowel is unremarkable in appearance. The stomach is within normal limits. No acute vascular abnormalities are seen. Mild calcification is noted along the distal abdominal aorta and its branches.  The appendix is normal in caliber, without evidence for appendicitis. Scattered diverticulosis is noted along the distal descending and proximal sigmoid colon.  There is diffuse wall thickening along the proximal sigmoid colon, with associated inflamed diverticula and a small amount of associated free fluid. Findings are compatible with acute diverticulitis. There is no definite evidence of perforation or abscess formation.  The bladder is mildly distended and grossly unremarkable. The prostate is borderline normal in size, with scattered calcification. No inguinal lymphadenopathy is seen.  No acute osseous abnormalities are identified.  IMPRESSION: 1. Acute diverticulitis at the proximal sigmoid colon, with diffuse wall thickening, inflamed diverticula and a small amount of associated free fluid. No definite evidence of perforation or abscess formation at this time. 2. Mild calcification along the distal abdominal aorta and its branches. 3. 3 mm nonobstructing stone at the lower pole of the right kidney.   Electronically Signed   By: Garald Balding M.D.   On: 08/12/2015 05:48    EKG:   Orders placed or performed in visit on 08/14/13  . EKG 12-Lead    ASSESSMENT AND PLAN:  This 49 year old Caucasian male with past medical history significant for diabetes and hypothyroidism admitted for diverticulitis and sepsis. 1. Diverticulitis of sigmoid colon;: No perforation seen on CT.; continue Cipro and Flagyl, surgical consult if the symptoms worsen. 2. Sepsis: Patient's criteria via leukocytosis and tachycardia. Follow blood cultures, continue IV antibiotics/ ., Continue IV fluids  3. Diabetes mellitus type 2: I continue sliding scale with coverage. Hold by mouth medications. 4.  Hypothyroidism: Continue Synthroid 5. Hypertension: Continue chlorthalidone, amlodipine and metoprolol succinate 6. Shortness sleep apnea: Continue CPAP.  7. DVT prophylaxis: Heparin   All the records are reviewed and case discussed with Care Management/Social Workerr. Management plans discussed with the patient, family and they are in agreement.  CODE STATUS: Full  TOTAL TIME TAKING CARE OF THIS PATIENT: 55 minutes.   POSSIBLE D/C IN 1-2 DAYS, DEPENDING ON CLINICAL CONDITION.   Epifanio Lesches M.D on 08/12/2015 at 1:19 PM  Between 7am to 6pm - Pager - [phone]  After 6pm go to [email or web address] - password [email or web address] [facility]  Office  [phone]  CC: Primary care physician; Enid Derry, MD

## 2015-08-13 LAB — CBC
HCT: 42.1 % (ref 40.0–52.0)
HEMOGLOBIN: 14.3 g/dL (ref 13.0–18.0)
MCH: 28.6 pg (ref 26.0–34.0)
MCHC: 33.9 g/dL (ref 32.0–36.0)
MCV: 84.5 fL (ref 80.0–100.0)
PLATELETS: 220 10*3/uL (ref 150–440)
RBC: 4.98 MIL/uL (ref 4.40–5.90)
RDW: 13.3 % (ref 11.5–14.5)
WBC: 9.6 10*3/uL (ref 3.8–10.6)

## 2015-08-13 LAB — GLUCOSE, CAPILLARY: Glucose-Capillary: 178 mg/dL — ABNORMAL HIGH (ref 65–99)

## 2015-08-13 MED ORDER — CIPROFLOXACIN HCL 500 MG PO TABS: 500.0000 mg | ORAL_TABLET | Freq: Two times a day (BID) | ORAL | Status: DC

## 2015-08-13 MED ORDER — METRONIDAZOLE 500 MG PO TABS: 500.0000 mg | ORAL_TABLET | Freq: Three times a day (TID) | ORAL | Status: DC

## 2015-08-13 MED ORDER — OXYMETAZOLINE HCL 0.05 % NA SOLN
1.0000 | Freq: Two times a day (BID) | NASAL | Status: DC
  Administered 2015-08-13: 1 via NASAL
  Filled 2015-08-13: qty 15

## 2015-08-13 NOTE — Progress Notes (Signed)
Discharge orders received. Reviewed the instructions with the patient and his wife, both verbalized understanding. A copy of the instructions were provided to the patient. Patient discharged home. No acute distress noted at the time of discharge.

## 2015-08-13 NOTE — Discharge Instructions (Signed)
Diverticulitis °Diverticulitis is when small pockets that have formed in your colon (large intestine) become infected or swollen. °HOME CARE °· Follow your doctor's instructions. °· Follow a special diet if told by your doctor. °· When you feel better, your doctor may tell you to change your diet. You may be told to eat a lot of fiber. Fruits and vegetables are good sources of fiber. Fiber makes it easier to poop (have bowel movements). °· Take supplements or probiotics as told by your doctor. °· Only take medicines as told by your doctor. °· Keep all follow-up visits with your doctor. °GET HELP IF: °· Your pain does not get better. °· You have a hard time eating food. °· You are not pooping like normal. °GET HELP RIGHT AWAY IF: °· Your pain gets worse. °· Your problems do not get better. °· Your problems suddenly get worse. °· You have a fever. °· You keep throwing up (vomiting). °· You have bloody or black, tarry poop (stool). °MAKE SURE YOU:  °· Understand these instructions. °· Will watch your condition. °· Will get help right away if you are not doing well or get worse. °Document Released: 06/03/2008 Document Revised: 12/21/2013 Document Reviewed: 11/10/2013 °ExitCare® Patient Information ©2015 [employer]. This information is not intended to replace advice given to you by your health care provider. Make sure you discuss any questions you have with your health care provider. ° °

## 2015-08-16 NOTE — Discharge Summary (Signed)
Nicholas Cabrera, is a 49 y.o. male  DOB 10/02/1966  MRN [id number].  Admission date:  08/12/2015  Admitting Physician  Harrie Foreman, MD  Discharge Date:  08/13/2015   Primary MD  Enid Derry, MD  Recommendations for primary care physician for things to follow:   Follow-up with primary doctor in 1 week.   Admission Diagnosis  Left lower quadrant pain [R10.32] Diverticulitis of large intestine without perforation or abscess without bleeding [K57.32]   Discharge Diagnosis  Left lower quadrant pain [R10.32] Diverticulitis of large intestine without perforation or abscess without bleeding [K57.32]    Active Problems:   Diverticulitis      Past Medical History  Diagnosis Date  . Vitamin D deficiency   . Low testosterone   . OSA (obstructive sleep apnea)   . Hypothyroidism   . Obesity   . Testicular hypofunction   . Hx of diverticulitis of colon   . Diabetes   . Hypertension   . Hypothyroidism   . Obesity   . Depression   . Asthma     Past Surgical History  Procedure Laterality Date  . Colonoscopy    . Polypectomy         History of present illness and  Hospital Course:     Kindly see H&P for history of present illness and admission details, please review complete Labs, Consult reports and Test reports for all details in brief  HPI  from the history and physical done on the day of admission 49 year old male patient admitted for abdominal pain. Patient had no nausea or vomiting. And complained of some constipation. Abdominal CAT scan that patient showed diverticulitis of sigmoid colon and also he had leukocytosis. So we admitted him for diverticulitis.    Hospital Course  #1 acute sigmoid diverticulitis with the abdominal pain ; started on IV  cipro and Flagyl. He also received IV fluids. Patient  started on clear liquids. Abdominal pain improved. Discharged home with Cipro and Flagyl. WBC normalized from 17.2-9.6. Gave  prescription for Cipro and Flagyl for 10 days.  2.Sepsis; due to diverticulitis improved with fluids. #3 diabetes type 2. Continue oral medications metformin was held on admission secondary to dehydration. Resumed at discharge..  Hypothyroidism continued on Synthroid Hypertension continued on home medications History of sleep apnea we continue CPAP.   Discharge Condition: stable   Follow UP  Follow-up Information    Follow up with Enid Derry, MD In 1 week.   Specialty:  Family Medicine   Contact information:   [place] [id number] [phone]         Discharge Instructions  and  Discharge Medications        Medication List    STOP taking these medications        FARXIGA 10 MG Tabs tablet  Generic drug:  dapagliflozin propanediol      TAKE these medications        amLODipine 10 MG tablet  Commonly known as:  NORVASC  Take 1 tablet (10 mg total) by mouth daily.     aspirin 81 MG tablet  Take 81 mg by mouth daily.     BAYER CONTOUR TEST test strip  Generic drug:  glucose blood  Check FSBS three times per day; dx 11.65     canagliflozin 100 MG Tabs tablet  Commonly known as:  INVOKANA  Take 1 tablet (100 mg total) by mouth daily. Preferred by insurance; this replaces [place]  chlorthalidone 25 MG tablet  Commonly known as:  HYGROTON  Take 1 tablet (25 mg total) by mouth daily.     ciprofloxacin 500 MG tablet  Commonly known as:  CIPRO  Take 1 tablet (500 mg total) by mouth 2 (two) times daily.     FLUoxetine 20 MG tablet  Commonly known as:  PROZAC  Take 1 tablet (20 mg total) by mouth daily.     Insulin Glargine 100 UNIT/ML Solostar Pen  Commonly known as:  LANTUS SOLOSTAR  Inject 40 Units into the skin daily at 10 pm.     Insulin Pen Needle 32G X 4 MM Misc  One needle per injection; for use with Lantus  SoloStar.     levothyroxine 75 MCG tablet  Commonly known as:  SYNTHROID, LEVOTHROID  Take 1 tablet (75 mcg total) by mouth daily before breakfast.     lisinopril 40 MG tablet  Commonly known as:  PRINIVIL,ZESTRIL  Take 1 tablet (40 mg total) by mouth daily.     metoprolol succinate 50 MG 24 hr tablet  Commonly known as:  TOPROL-XL  Take 1 tablet (50 mg total) by mouth daily. Take with or immediately following a meal.     metroNIDAZOLE 500 MG tablet  Commonly known as:  FLAGYL  Take 1 tablet (500 mg total) by mouth every 8 (eight) hours.     sitaGLIPtin-metformin 50-1000 MG per tablet  Commonly known as:  JANUMET  Take 1 tablet by mouth 2 (two) times daily with a meal.     vitamin B-12 1000 MCG tablet  Commonly known as:  CYANOCOBALAMIN  Take 1,500 mcg by mouth daily.     Vitamin D 2000 UNITS Caps  Take 2,000 Units by mouth daily.          Diet and Activity recommendation: See Discharge Instructions above   Consults obtained - none   Major procedures and Radiology Reports - PLEASE review detailed and final reports for all details, in brief -      Ct Abdomen Pelvis W Contrast  08/12/2015   CLINICAL DATA:  Acute onset of lower abdominal pain and pressure. Unable to urinate. Nausea. Initial encounter.  EXAM: CT ABDOMEN AND PELVIS WITH CONTRAST  TECHNIQUE: Multidetector CT imaging of the abdomen and pelvis was performed using the standard protocol following bolus administration of intravenous contrast.  CONTRAST:  190mL OMNIPAQUE IOHEXOL 300 MG/ML  SOLN  COMPARISON:  CT of the abdomen and pelvis from 01/10/2014  FINDINGS: The visualized lung bases are clear.  The liver and spleen are unremarkable in appearance. The gallbladder is within normal limits. The pancreas and adrenal glands are unremarkable.  Minimal nonspecific perinephric stranding is noted bilaterally. A 3 mm stone is noted at the lower pole of the right kidney. The kidneys are otherwise unremarkable. There is no  evidence of hydronephrosis. No obstructing ureteral stones are seen.  No free fluid is identified. The small bowel is unremarkable in appearance. The stomach is within normal limits. No acute vascular abnormalities are seen. Mild calcification is noted along the distal abdominal aorta and its branches.  The appendix is normal in caliber, without evidence for appendicitis. Scattered diverticulosis is noted along the distal descending and proximal sigmoid colon.  There is diffuse wall thickening along the proximal sigmoid colon, with associated inflamed diverticula and a small amount of associated free fluid. Findings are compatible with acute diverticulitis. There is no definite evidence of perforation or abscess formation.  The bladder is mildly distended  and grossly unremarkable. The prostate is borderline normal in size, with scattered calcification. No inguinal lymphadenopathy is seen.  No acute osseous abnormalities are identified.  IMPRESSION: 1. Acute diverticulitis at the proximal sigmoid colon, with diffuse wall thickening, inflamed diverticula and a small amount of associated free fluid. No definite evidence of perforation or abscess formation at this time. 2. Mild calcification along the distal abdominal aorta and its branches. 3. 3 mm nonobstructing stone at the lower pole of the right kidney.   Electronically Signed   By: Garald Balding M.D.   On: 08/12/2015 05:48    Micro Results     No results found for this or any previous visit (from the past 240 hour(s)).     Today   Subjective:   Nicholas Cabrera today has no headache,no chest abdominal pain,no new weakness tingling or numbness, feels much better wants to go home today.   Objective:   Blood pressure 127/70, pulse 132, temperature 98.1 F (36.7 C), temperature source Oral, resp. rate 17, height 5\' 9"  (1.753 m), weight 124.921 kg (275 lb 6.4 oz), SpO2 96 %.  No intake or output data in the 24 hours ending 08/16/15  1353  Exam Awake Alert, Oriented x 3, No new F.N deficits, Normal affect [place].AT,PERRAL Supple Neck,No JVD, No cervical lymphadenopathy appriciated.  Symmetrical Chest wall movement, Good air movement bilaterally, CTAB RRR,No Gallops,Rubs or new Murmurs, No Parasternal Heave +ve B.Sounds, Abd Soft, Non tender, No organomegaly appriciated, No rebound -guarding or rigidity. No Cyanosis, Clubbing or edema, No new Rash or bruise  Data Review   CBC w Diff:  Lab Results  Component Value Date   WBC 9.6 08/13/2015   WBC 11.7* 01/13/2014   HGB 14.3 08/13/2015   HGB 13.8 01/13/2014   HCT 42.1 08/13/2015   HCT 41.6 01/13/2014   PLT 220 08/13/2015   PLT 331 01/13/2014   LYMPHOPCT 13.1 01/13/2014   MONOPCT 7.2 01/13/2014   EOSPCT 4.5 01/13/2014   BASOPCT 0.9 01/13/2014    CMP:  Lab Results  Component Value Date   NA 138 08/11/2015   NA 140 06/29/2015   NA 136 01/10/2014   K 3.5 08/11/2015   K 3.3* 01/10/2014   CL 101 08/11/2015   CL 101 01/10/2014   CO2 28 08/11/2015   CO2 33* 01/10/2014   BUN 8 08/11/2015   BUN 8 06/29/2015   BUN 10 01/10/2014   CREATININE 0.63 08/11/2015   CREATININE 0.82 01/10/2014   PROT 7.3 08/11/2015   PROT 7.8 01/10/2014   ALBUMIN 4.0 08/11/2015   ALBUMIN 3.4 01/10/2014   BILITOT 0.7 08/11/2015   BILITOT 0.7 01/10/2014   ALKPHOS 52 08/11/2015   ALKPHOS 70 01/10/2014   AST 26 08/11/2015   AST 12* 01/10/2014   ALT 25 08/11/2015   ALT 22 01/10/2014  .   Total Time in preparing paper work, data evaluation and todays exam - 14 minutes  Evin Chirco M.D on 08/13/2015 at 1:53 PM

## 2015-09-07 ENCOUNTER — Telehealth: Payer: Self-pay

## 2015-09-07 NOTE — Telephone Encounter (Signed)
[facility] Unable to find patient  Nicholas Cabrera 10mg  Daily

## 2015-09-07 NOTE — Telephone Encounter (Signed)
Patient notified via voicemail.

## 2015-09-07 NOTE — Telephone Encounter (Signed)
He should NOT be on both Invokana and Farxiga I prescribed plenty of Invokana per med history Let him know he should only be on that one; not both

## 2016-02-22 LAB — HM DIABETES EYE EXAM

## 2016-03-22 ENCOUNTER — Telehealth: Payer: Self-pay | Admitting: Family Medicine

## 2016-03-22 MED ORDER — ATORVASTATIN CALCIUM 10 MG PO TABS: 10.0000 mg | ORAL_TABLET | Freq: Every day | ORAL | Status: DC

## 2016-03-22 NOTE — Telephone Encounter (Signed)
I also received note from [facility] suggesting statin; last LDL was 79, last HDL was 40, but I agree to start low dose statin; I called pt, left detailed msg, he gets to hear from me twice today; I'm sending in medicine for cholesterol to [facility]; if he needs refill of other medicines, all he needs to do is call and we'll get him refills and then see him soon

## 2016-03-22 NOTE — Telephone Encounter (Signed)
I received notice of his eye exam Patient was supposed to have seen me around September 30th He did not come in; we had a long talk at that visit about compliance Per my prescriptions, it looks like he should have run out of medicines months ago (?); he has not called for refills so perhaps he has established with another doctor I personally called patient, left detailed msg that I got his note about his eye exam It's been a while since his last appt and I very much want to make sure that he's getting care; I'm leaving this practice and want to make sure he gets an appt for follow-up with me at next office or with someone here at [facility] I left message asking him to call the office to work with staff to arrange for f/u appointment

## 2016-04-23 ENCOUNTER — Other Ambulatory Visit
Admission: RE | Admit: 2016-04-23 | Discharge: 2016-04-23 | Disposition: A | Discharge status: Home / Self Care | Payer: BC Managed Care – PPO | Source: Ambulatory Visit | Attending: Family Medicine | Admitting: Family Medicine

## 2016-04-23 ENCOUNTER — Ambulatory Visit (INDEPENDENT_AMBULATORY_CARE_PROVIDER_SITE_OTHER): Payer: BC Managed Care – PPO | Admitting: Family Medicine

## 2016-04-23 VITALS — BP 142/94 | HR 94 | Temp 98.4°F | Resp 16 | Ht 69.0 in | Wt 267.5 lb

## 2016-04-23 DIAGNOSIS — E039 Hypothyroidism, unspecified: Secondary | ICD-10-CM | POA: Insufficient documentation

## 2016-04-23 DIAGNOSIS — E785 Hyperlipidemia, unspecified: Secondary | ICD-10-CM

## 2016-04-23 DIAGNOSIS — E669 Obesity, unspecified: Secondary | ICD-10-CM

## 2016-04-23 DIAGNOSIS — D485 Neoplasm of uncertain behavior of skin: Secondary | ICD-10-CM | POA: Diagnosis not present

## 2016-04-23 DIAGNOSIS — E1165 Type 2 diabetes mellitus with hyperglycemia: Secondary | ICD-10-CM | POA: Diagnosis not present

## 2016-04-23 DIAGNOSIS — R252 Cramp and spasm: Secondary | ICD-10-CM

## 2016-04-23 DIAGNOSIS — IMO0001 Reserved for inherently not codable concepts without codable children: Secondary | ICD-10-CM

## 2016-04-23 DIAGNOSIS — E1142 Type 2 diabetes mellitus with diabetic polyneuropathy: Secondary | ICD-10-CM | POA: Insufficient documentation

## 2016-04-23 DIAGNOSIS — I10 Essential (primary) hypertension: Secondary | ICD-10-CM | POA: Diagnosis not present

## 2016-04-23 DIAGNOSIS — I739 Peripheral vascular disease, unspecified: Secondary | ICD-10-CM | POA: Diagnosis not present

## 2016-04-23 DIAGNOSIS — Z5181 Encounter for therapeutic drug level monitoring: Secondary | ICD-10-CM | POA: Diagnosis not present

## 2016-04-23 DIAGNOSIS — Z794 Long term (current) use of insulin: Secondary | ICD-10-CM | POA: Diagnosis not present

## 2016-04-23 DIAGNOSIS — G4733 Obstructive sleep apnea (adult) (pediatric): Secondary | ICD-10-CM

## 2016-04-23 DIAGNOSIS — R0989 Other specified symptoms and signs involving the circulatory and respiratory systems: Secondary | ICD-10-CM

## 2016-04-23 DIAGNOSIS — IMO0002 Reserved for concepts with insufficient information to code with codable children: Secondary | ICD-10-CM

## 2016-04-23 LAB — LIPID PANEL
CHOL/HDL RATIO: 4 ratio
CHOLESTEROL: 172 mg/dL (ref 0–200)
HDL: 43 mg/dL (ref >40)
LDL Cholesterol: 66 mg/dL (ref 0–99)
Triglycerides: 313 mg/dL — ABNORMAL HIGH (ref <150)
VLDL: 63 mg/dL — ABNORMAL HIGH (ref 0–40)

## 2016-04-23 LAB — COMPREHENSIVE METABOLIC PANEL
ALK PHOS: 53 U/L (ref 38–126)
ALT: 43 U/L (ref 17–63)
ANION GAP: 11 (ref 5–15)
AST: 33 U/L (ref 15–41)
Albumin: 4.1 g/dL (ref 3.5–5.0)
BILIRUBIN TOTAL: 0.8 mg/dL (ref 0.3–1.2)
BUN: 12 mg/dL (ref 6–20)
CALCIUM: 9.2 mg/dL (ref 8.9–10.3)
CO2: 27 mmol/L (ref 22–32)
Chloride: 98 mmol/L — ABNORMAL LOW (ref 101–111)
Creatinine, Ser: 0.66 mg/dL (ref 0.61–1.24)
Glucose, Bld: 201 mg/dL — ABNORMAL HIGH (ref 65–99)
POTASSIUM: 3.5 mmol/L (ref 3.5–5.1)
Sodium: 136 mmol/L (ref 135–145)
TOTAL PROTEIN: 7.3 g/dL (ref 6.5–8.1)

## 2016-04-23 LAB — TSH: TSH: 2.173 u[IU]/mL (ref 0.350–4.500)

## 2016-04-23 LAB — POCT GLYCOSYLATED HEMOGLOBIN (HGB A1C): HEMOGLOBIN A1C: 11.3

## 2016-04-23 LAB — MAGNESIUM: Magnesium: 1.5 mg/dL — ABNORMAL LOW (ref 1.7–2.4)

## 2016-04-23 MED ORDER — INSULIN PEN NEEDLE 31G X 8 MM MISC: 1.0000 | Freq: Every day | Status: DC

## 2016-04-23 MED ORDER — LEVEMIR FLEXTOUCH 100 UNIT/ML ~~LOC~~ SOPN: 50.0000 [IU] | PEN_INJECTOR | Freq: Every day | SUBCUTANEOUS | Status: DC

## 2016-04-23 NOTE — Assessment & Plan Note (Signed)
Work on weight loss; limit salt; check creatinine today; so important to follow DASH guidelines

## 2016-04-23 NOTE — Assessment & Plan Note (Signed)
Sounds as though patient needs to have his obstructive sleep apnea re-evaluated; refer to pulmonologist

## 2016-04-23 NOTE — Patient Instructions (Addendum)
Check out stretching exercises for lower back and hamstrings; there is an open invitation in your chart for a referral to physical therapy if you decide you'd like that I'm referring you to endocrinology for your diabetes Do take one of your blood pressure pills at bedtime and work with your pharmacist about drug-drug interactions and best times to take them Please go to the hospital today for labs If you have not heard anything from my staff in a week about any orders/referrals/studies from today, please contact us here to follow-up [phone] Return in 4 weeks Your goal blood pressure is less than 130 mmHg on top. Try to follow the DASH guidelines (DASH stands for Dietary Approaches to Stop Hypertension) Try to limit the sodium in your diet.  Ideally, consume less than 1.5 grams (less than 1,500mg ) per day. Do not add salt when cooking or at the table.  Check the sodium amount on labels when shopping, and choose items lower in sodium when given a choice. Avoid or limit foods that already contain a lot of sodium. Eat a diet rich in fruits and vegetables and whole grains.

## 2016-04-23 NOTE — Progress Notes (Signed)
BP 142/94 mmHg  Pulse 94  Temp(Src) 98.4 F (36.9 C) (Oral)  Resp 16  Ht 5\' 9"  (1.753 m)  Wt 267 lb 8 oz (121.337 kg)  BMI 39.48 kg/m2  SpO2 97%   Subjective:    Patient ID: Nicholas Cabrera, male    DOB: [birth year], 50 y.o.   MRN: [id number]  HPI: TAKASHI ATALLAH is a 50 y.o. male  Chief Complaint  Patient presents with  . Follow-up  . Hypertension  . Diabetes    high-400's, low-259  . Leg Pain    having alot of leg cramps at night  . 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 He did not take his BP medicines today so he thinks that might be why they are up; I don't think he took them yesterday either Soreness in the right plantar fascia instep right foot worse than left; numbness in the balls of the feet Working a lot; getting up at 4 am Needs to have another sleep study; he is using CPAP, starts off at a 4 and a 4 minute ramp to gradually increasing to 16 cm H2O; he is literally waking up at 4 am, going to work and there by 7 am, he is nodding off by 10 am though HTN; not checking BP; not adding salt to his food, has backed off of soda He has lost 11 pounds since June cutting out sodas; he has tried to eat healthier; still eating frozen meals, has to do what he has to do, feels forced to eat Instead of regular potato chips, doing wheat thins instead; trying to eat tuna Still bringing a lot of food in from [employer]; got grilled chicken; avoiding pickles, regular bun, nothing on it High cholesterol; not taking one at all Thyroid; I  asked about s/s; no jitteriness, no loose stools; wt loss is intentional  He gets a sharp pain across his upper chest; every once in a while; tender and sore with pressing against it; fluttering and felt light in his chest, 3-4 seconds; enough to catch his attention; no pressure; there is a discrete place he can touch in the pec in the muscle that is sore; he does not want to see a heart doctor  Depression screen [facility] 2/9 04/23/2016  Decreased Interest 0  Down, Depressed, Hopeless 0  PHQ - 2 Score 0   Relevant past medical, surgical, family and social history reviewed and updated as indicated. Past Medical History  Diagnosis Date  . Vitamin D deficiency   . Low testosterone   . OSA (obstructive sleep apnea)   . Hypothyroidism   . Obesity   . Testicular hypofunction   . Hx of diverticulitis of colon   . Diabetes   . Hypertension   . Hypothyroidism   . Obesity   . Depression   . Asthma    Past Surgical History  Procedure Laterality Date  . Colonoscopy    . Polypectomy     Family History  Problem Relation Age of Onset  . Hypertension Mother   . Stroke Father   . Hypertension Father   . Diabetes Father   . Glaucoma Father   . Heart disease Maternal Grandfather   . Stroke Paternal Grandmother   . Diabetes Paternal Grandmother   . Diabetes Paternal Grandfather   . Lung disease Paternal Grandfather   . Cancer Maternal Uncle     lung   Social History  Substance Use Topics  . Smoking status: Former Smoker    Quit date: 05/17/2006  . Smokeless tobacco: Never Used  . Alcohol Use: No   Interim medical history since our last visit reviewed. Allergies and medications reviewed and updated.  Review of Systems Per HPI unless specifically indicated above     Objective:    BP 142/94 mmHg  Pulse 94  Temp(Src) 98.4 F (36.9 C) (Oral)  Resp 16  Ht 5\' 9"  (1.753 m)  Wt 267 lb 8 oz (121.337 kg)  BMI 39.48 kg/m2  SpO2 97%  Wt Readings from Last 3 Encounters:  04/23/16 267  lb 8 oz (121.337 kg)  08/13/15 275 lb 6.4 oz (124.921 kg)  06/29/15 278 lb (126.1 kg)    Physical Exam  Constitutional: He appears well-developed and well-nourished.  Obese, no distress  Eyes: No scleral icterus.  Neck: No JVD present.  Cardiovascular: Normal rate and regular rhythm.   Pulmonary/Chest: Effort normal and breath sounds normal. No respiratory distress. He has no wheezes.  Abdominal: He exhibits no distension.  Musculoskeletal: He exhibits no edema.  Neurological: He is alert.  Skin: Lesion (domed erythematous lesion right anterior shin; three darker lesions left shin) noted. No pallor.  Psychiatric: He has a normal mood and affect.   Diabetic Foot Form - Detailed   Diabetic Foot Exam - detailed  Diabetic Foot exam was performed with the following findings:  Yes 04/23/2016  4:48 PM  Visual Foot Exam completed.:  Yes  Are the toenails long?:  No  Is there elevated skin temparature?:  No  Are the toenails ingrown?:  No  Normal Range of Motion:  Yes    Pulse Foot Exam completed.:  Yes  Right Dorsalis Pedis:  Diminished Left Dorsalis Pedis:  Diminished  Sensory Foot Exam Completed.:  Yes  Swelling:  No  Semmes-Weinstein Monofilament Test    Comments:  Diminished sensation balls of the feet; toes are cooler than proximal feet     Results for orders placed or performed in visit on 04/23/16  POCT HgB A1C  Result Value Ref Range   Hemoglobin A1C 11.3       Assessment & Plan:   Problem List Items Addressed This Visit      Cardiovascular and Mediastinum   Hypertension (Chronic)    Work on weight loss; limit salt; check creatinine today; so important to follow DASH guidelines      Poor circulation of extremity (HCC)     Respiratory   Obstructive sleep apnea    Sounds as though patient needs to have his obstructive sleep apnea re-evaluated; refer to pulmonologist      Relevant Orders   Ambulatory referral to Pulmonology     Endocrine   Type 2 diabetes  mellitus, uncontrolled ([place]) - Primary (Chronic)    Uncontrolled today, reviewed A1c with him; concerned about his elevated sugars; cautioned about dangers; refer to endocrinologist for evaluation and management of his poorly controlled diabetes; foot exam by MD today      Relevant Medications  LEVEMIR FLEXTOUCH 100 UNIT/ML Pen   Other Relevant Orders   Ambulatory referral to Endocrinology   Urine Microalbumin w/creat. ratio   POCT HgB A1C (Completed)   Hypothyroidism (Chronic)    Check TSH      Relevant Orders   TSH     Other   Hyperlipidemia (Chronic)    Limit saturated fats; continue to make smart choices when able; check lipids; whole grains      Relevant Orders   Lipid Panel w/o Chol/HDL Ratio   Obesity (Chronic)    Continue to work on weight loss; if patient could lose significant weight, his medication burden and overall health would improve      Relevant Medications   LEVEMIR FLEXTOUCH 100 UNIT/ML Pen    Other Visit Diagnoses    Medication monitoring encounter        Relevant Orders    Comprehensive metabolic panel    Cramp of both lower extremities        Relevant Orders    Magnesium    Neoplasm of uncertain behavior of skin of lower leg        Relevant Orders    Ambulatory referral to Dermatology       Follow up plan: Return in about 4 weeks (around 05/21/2016) for follow-up.  An after-visit summary was printed and given to the patient at [place].  Please see the patient instructions which may contain other information and recommendations beyond what is mentioned above in the assessment and plan.  Meds ordered this encounter  Medications  . Cinnamon ([facility] CINNAMON) 500 MG capsule    Sig: Take 500 mg by mouth 2 (two) times daily.  Marland Kitchen DISCONTD: LEVEMIR FLEXTOUCH 100 UNIT/ML Pen    Sig:   . LEVEMIR FLEXTOUCH 100 UNIT/ML Pen    Sig: Inject 50 Units into the skin daily.    Dispense:  15 mL    Refill:  1  . Insulin Pen Needle (CLICKFINE PEN NEEDLES) 31G X  8 MM MISC    Sig: 1 each by Does not apply route daily.    Dispense:  100 each    Refill:  3   Orders Placed This Encounter  Procedures  . Comprehensive metabolic panel  . Lipid Panel w/o Chol/HDL Ratio  . Urine Microalbumin w/creat. ratio  . TSH  . Magnesium  . Ambulatory referral to Endocrinology  . Ambulatory referral to Dermatology  . Ambulatory referral to Pulmonology  . POCT HgB A1C

## 2016-04-23 NOTE — Assessment & Plan Note (Signed)
Limit saturated fats; continue to make smart choices when able; check lipids; whole grains

## 2016-04-23 NOTE — Assessment & Plan Note (Addendum)
Uncontrolled today, reviewed A1c with him; concerned about his elevated sugars; cautioned about dangers; refer to endocrinologist for evaluation and management of his poorly controlled diabetes; foot exam by MD today

## 2016-04-23 NOTE — Assessment & Plan Note (Addendum)
Continue to work on weight loss; if patient could lose significant weight, his medication burden and overall health would improve

## 2016-04-23 NOTE — Assessment & Plan Note (Signed)
Check TSH 

## 2016-04-24 ENCOUNTER — Telehealth: Payer: Self-pay | Admitting: Family Medicine

## 2016-04-24 DIAGNOSIS — R0989 Other specified symptoms and signs involving the circulatory and respiratory systems: Secondary | ICD-10-CM | POA: Insufficient documentation

## 2016-04-24 LAB — MICROALBUMIN / CREATININE URINE RATIO
CREATININE, UR: 140.1 mg/dL
Microalb Creat Ratio: 25.1 mg/g creat (ref 0.0–30.0)
Microalb, Ur: 35.1 ug/mL — ABNORMAL HIGH

## 2016-04-24 NOTE — Telephone Encounter (Signed)
Labs did not come to my desktop I searched them out Called pt, left message that Mg2+ was low; pick up some Mag Ox 400 mg and take one daily (may be from metformin) TG are high; work on weight loss and getting sugars down I reiterated that with his sleep apnea, never drive if tired; catch a ride with someone or take a bus, etc, but don't put himself at risk of falling asleep behind the wheel Want to make sure he is taking his aspirin Left message asking him to call me back to go over all labs

## 2016-05-01 NOTE — Telephone Encounter (Signed)
Left voice mail

## 2016-05-01 NOTE — Telephone Encounter (Signed)
Patient has called me back Please call patient; if not there, just leave msg Want to make sure he is taking some magnesium oxide 400 mg daily Triglycerides were high; start fish oil or krill oil BID Take aspirin 81 mg daily

## 2016-05-21 ENCOUNTER — Encounter: Payer: Self-pay | Admitting: Family Medicine

## 2016-05-21 ENCOUNTER — Other Ambulatory Visit
Admission: RE | Admit: 2016-05-21 | Discharge: 2016-05-21 | Disposition: A | Discharge status: Home / Self Care | Payer: BC Managed Care – PPO | Source: Ambulatory Visit | Attending: Family Medicine | Admitting: Family Medicine

## 2016-05-21 ENCOUNTER — Ambulatory Visit (INDEPENDENT_AMBULATORY_CARE_PROVIDER_SITE_OTHER): Payer: BC Managed Care – PPO | Admitting: Family Medicine

## 2016-05-21 ENCOUNTER — Telehealth: Payer: Self-pay | Admitting: Family Medicine

## 2016-05-21 DIAGNOSIS — E291 Testicular hypofunction: Secondary | ICD-10-CM

## 2016-05-21 DIAGNOSIS — N529 Male erectile dysfunction, unspecified: Secondary | ICD-10-CM | POA: Insufficient documentation

## 2016-05-21 DIAGNOSIS — G4733 Obstructive sleep apnea (adult) (pediatric): Secondary | ICD-10-CM | POA: Diagnosis not present

## 2016-05-21 DIAGNOSIS — E559 Vitamin D deficiency, unspecified: Secondary | ICD-10-CM | POA: Insufficient documentation

## 2016-05-21 DIAGNOSIS — R5383 Other fatigue: Secondary | ICD-10-CM | POA: Diagnosis not present

## 2016-05-21 LAB — BASIC METABOLIC PANEL
ANION GAP: 9 (ref 5–15)
BUN: 12 mg/dL (ref 6–20)
CO2: 28 mmol/L (ref 22–32)
Calcium: 9.2 mg/dL (ref 8.9–10.3)
Chloride: 96 mmol/L — ABNORMAL LOW (ref 101–111)
Creatinine, Ser: 0.61 mg/dL (ref 0.61–1.24)
Glucose, Bld: 227 mg/dL — ABNORMAL HIGH (ref 65–99)
POTASSIUM: 3.6 mmol/L (ref 3.5–5.1)
SODIUM: 133 mmol/L — AB (ref 135–145)

## 2016-05-21 LAB — PSA: PSA: 0.62 ng/mL (ref 0.00–4.00)

## 2016-05-21 LAB — MAGNESIUM: MAGNESIUM: 1.6 mg/dL — AB (ref 1.7–2.4)

## 2016-05-21 MED ORDER — MAGNESIUM 500 MG PO TABS: 1.0000 | ORAL_TABLET | Freq: Two times a day (BID) | ORAL | Status: DC

## 2016-05-21 NOTE — Patient Instructions (Addendum)
Do NOT drive if you are tired, sleepy; you could cause an accident which could be fatal to you or others I have put in a high priority neurology referral to have you evaluated for the sleep apnea but also to check for other conditions which can cause fatigue Go now to the hospital for STAT labs -- we'll let you know the next step We'll see about increasing your magnesium or arranging for an infusion Out of work Wed, [employer], Friday

## 2016-05-21 NOTE — Progress Notes (Signed)
BP 136/94 mmHg  Pulse 92  Temp(Src) 98.5 F (36.9 C) (Oral)  Resp 16  Wt 268 lb (121.564 kg)  SpO2 97%   Subjective:    Patient ID: Nicholas Cabrera, male    DOB: May 27, 1966, 50 y.o.   MRN: [id number]  HPI: Nicholas Cabrera is a 50 y.o. male  Chief Complaint  Patient presents with  . Follow-up    4 week   Has been working much too much; coming off of 8 days straight; off today, then back for 4 more straight day; just tired Has sleep apnea and does not think the CPAP is working at all; he is physically and mentally drained; he can sit at work and just eyes will get heavy and he can fall asleep at the drop of a hat; too tired to go to the gym  He has leg cramps; using tonic water, drinking Gatorade; ate bananas, trying magnesium; leg cramp has woken him sleep; urine has been clear but smelled like ammonia; no burning with urination; he has been taking 500 mg magnesium a day  No chest pain; no SHOB; not depressed  Depression screen [facility] 2/9 05/21/2016 04/23/2016  Decreased Interest 0 0  Down, Depressed, Hopeless 0 0  PHQ - 2 Score 0 0   Relevant past medical, surgical, family and social history reviewed Past Medical History  Diagnosis Date  . Vitamin D deficiency   . Low testosterone   . OSA (obstructive sleep apnea)   . Hypothyroidism   . Obesity   . Testicular hypofunction   . Hx of diverticulitis of colon   . Diabetes ([place])   . Hypertension   . Hypothyroidism   . Obesity   . Depression   . Asthma    Past Surgical History  Procedure Laterality Date  . Colonoscopy    . Polypectomy     Family History  Problem Relation Age of Onset  . Hypertension Mother   . Stroke Father   . Hypertension Father   . Diabetes Father   . Glaucoma Father   . Heart disease Maternal Grandfather   . Stroke Paternal Grandmother   . Diabetes Paternal Grandmother   . Diabetes Paternal Grandfather   . Lung disease Paternal Grandfather   . Cancer Maternal Uncle     lung   Social  History  Substance Use Topics  . Smoking status: Former Smoker    Quit date: 05/17/2006  . Smokeless tobacco: Never Used  . Alcohol Use: No   Interim medical history since last visit reviewed. Allergies and medications reviewed  Review of Systems Per HPI unless specifically indicated above     Objective:    BP 136/94 mmHg  Pulse 92  Temp(Src) 98.5 F (36.9 C) (Oral)  Resp 16  Wt 268 lb (121.564 kg)  SpO2 97%  Wt Readings from Last 3 Encounters:  06/06/16 261 lb (118.389 kg)  05/21/16 268 lb (121.564 kg)  04/23/16 267 lb 8 oz (121.337 kg)   body mass index is 39.56 kg/(m^2).  Physical Exam  Constitutional: He appears well-developed and well-nourished. No distress.  oebse  HENT:  Head: Normocephalic and atraumatic.  Eyes: EOM are normal. No scleral icterus.  Neck: No thyromegaly present.  Cardiovascular: Normal rate and regular rhythm.   Pulmonary/Chest: Effort normal and breath sounds normal.  Abdominal: Soft. Bowel sounds are normal. He exhibits no distension.  Musculoskeletal: He exhibits no edema.  Visible fasciculations in the calves, muscle cramps  Neurological: Coordination normal.  Skin: Skin is warm and dry. No pallor.  Psychiatric: He has a normal mood and affect. His behavior is normal. Judgment and thought content normal.   Results for orders placed or performed during the hospital encounter of 04/23/16  Magnesium  Result Value Ref Range   Magnesium 1.5 (L) 1.7 - 2.4 mg/dL  Comprehensive metabolic panel  Result Value Ref Range   Sodium 136 135 - 145 mmol/L   Potassium 3.5 3.5 - 5.1 mmol/L   Chloride 98 (L) 101 - 111 mmol/L   CO2 27 22 - 32 mmol/L   Glucose, Bld 201 (H) 65 - 99 mg/dL   BUN 12 6 - 20 mg/dL   Creatinine, Ser 0.66 0.61 - 1.24 mg/dL   Calcium 9.2 8.9 - 10.3 mg/dL   Total Protein 7.3 6.5 - 8.1 g/dL   Albumin 4.1 3.5 - 5.0 g/dL   AST 33 15 - 41 U/L   ALT 43 17 - 63 U/L   Alkaline Phosphatase 53 38 - 126 U/L   Total Bilirubin 0.8 0.3  - 1.2 mg/dL   GFR calc non Af Amer >60 >60 mL/min   GFR calc Af Amer >60 >60 mL/min   Anion gap 11 5 - 15  Lipid panel  Result Value Ref Range   Cholesterol 172 0 - 200 mg/dL   Triglycerides 313 (H) <150 mg/dL   HDL 43 >40 mg/dL   Total CHOL/HDL Ratio 4.0 RATIO   VLDL 63 (H) 0 - 40 mg/dL   LDL Cholesterol 66 0 - 99 mg/dL  TSH  Result Value Ref Range   TSH 2.173 0.350 - 4.500 uIU/mL  Microalbumin / creatinine urine ratio  Result Value Ref Range   Microalb, Ur 35.1 (H) Not Estab. ug/mL   Microalb Creat Ratio 25.1 0.0 - 30.0 mg/g creat   Creatinine, Urine 140.1 Not Estab. mg/dL      Assessment & Plan:   Problem List Items Addressed This Visit      Respiratory   Obstructive sleep apnea    Not controlled; I stressed to patient to NOT drive if tired; refer to neurologist      Relevant Orders   Ambulatory referral to Neurology     Endocrine   Hypogonadism in male   Relevant Orders   Testosterone,Free and Total   PSA     Genitourinary   Erectile dysfunction   Relevant Orders   PSA     Other   Fatigue    Refer to neurologist; suspect this is related to OSA, but likely multifactorial; however, he needs to be worked up for other issues such as narcolepsy that could exacerbate this      Relevant Orders   Ambulatory referral to Neurology   Hypomagnesemia - Primary   Relevant Orders   Magnesium   Basic metabolic panel   Vitamin D deficiency   Relevant Orders   VITAMIN D 25 Hydroxy (Vit-D Deficiency, Fractures)      Follow up plan: No Follow-up on file.  An after-visit summary was printed and given to the patient at [place].  Please see the patient instructions which may contain other information and recommendations beyond what is mentioned above in the assessment and plan.  Orders Placed This Encounter  Procedures  . Magnesium  . Basic metabolic panel  . VITAMIN D 25 Hydroxy (Vit-D Deficiency, Fractures)  . Testosterone,Free and Total  . PSA  . Ambulatory  referral to Neurology

## 2016-05-21 NOTE — Telephone Encounter (Signed)
I tried to call pt; no answer Roselyn Reef, Please call patient; let him know he does NOT need a magnesium infusion at the hospital today The lab was STAT but they never called me, and I can't reach him to tell him he can go home Instruct him to take one magnesium pill TWICE a day for the next week and then once a day His sodium and his chloride are a little low, so decrease the chlorthalidone to just HALF of a pill by mouth daily for 3 days and we'll let those electrolytes come back up Monitor BP at home daily and let us know if trending up over 140/90 Return to see me on or after July 26th for next diabetes / cholesterol / HTN visit, but I'm here sooner if he wants to be seen or if symptoms persist

## 2016-05-21 NOTE — Assessment & Plan Note (Signed)
Refer to neurologist; suspect this is related to OSA, but likely multifactorial; however, he needs to be worked up for other issues such as narcolepsy that could exacerbate this

## 2016-05-21 NOTE — Assessment & Plan Note (Addendum)
Not controlled; I stressed to patient to NOT drive if tired; refer to neurologist

## 2016-05-22 LAB — TESTOSTERONE,FREE AND TOTAL
TESTOSTERONE: 154 ng/dL — AB (ref 348–1197)
Testosterone, Free: 5.6 pg/mL — ABNORMAL LOW (ref 6.8–21.5)

## 2016-05-22 LAB — VITAMIN D 25 HYDROXY (VIT D DEFICIENCY, FRACTURES): Vit D, 25-Hydroxy: 25.9 ng/mL — ABNORMAL LOW (ref 30.0–100.0)

## 2016-05-23 NOTE — Telephone Encounter (Signed)
Please see below, plus let pt know that his vit D is low; add an extra 2000 iu vit D3 once a day; there are other results in lab section

## 2016-05-28 NOTE — Telephone Encounter (Signed)
Please see below.

## 2016-05-29 NOTE — Telephone Encounter (Signed)
Pt already notified

## 2016-06-06 ENCOUNTER — Encounter: Payer: Self-pay | Admitting: Family Medicine

## 2016-06-06 ENCOUNTER — Other Ambulatory Visit: Payer: Self-pay | Admitting: Family Medicine

## 2016-06-06 ENCOUNTER — Ambulatory Visit (INDEPENDENT_AMBULATORY_CARE_PROVIDER_SITE_OTHER): Payer: BC Managed Care – PPO | Admitting: Family Medicine

## 2016-06-06 VITALS — BP 138/86 | HR 101 | Temp 97.6°F | Resp 16 | Wt 261.0 lb

## 2016-06-06 DIAGNOSIS — Z794 Long term (current) use of insulin: Principal | ICD-10-CM

## 2016-06-06 DIAGNOSIS — E1165 Type 2 diabetes mellitus with hyperglycemia: Principal | ICD-10-CM

## 2016-06-06 DIAGNOSIS — J0100 Acute maxillary sinusitis, unspecified: Secondary | ICD-10-CM

## 2016-06-06 DIAGNOSIS — G4733 Obstructive sleep apnea (adult) (pediatric): Secondary | ICD-10-CM | POA: Diagnosis not present

## 2016-06-06 DIAGNOSIS — IMO0001 Reserved for inherently not codable concepts without codable children: Secondary | ICD-10-CM

## 2016-06-06 MED ORDER — AMOXICILLIN-POT CLAVULANATE 875-125 MG PO TABS: 1.0000 | ORAL_TABLET | Freq: Two times a day (BID) | ORAL | Status: DC

## 2016-06-06 NOTE — Assessment & Plan Note (Signed)
Note from insurance received; no eye exam in the last year; I'm entering referral

## 2016-06-06 NOTE — Patient Instructions (Signed)
Start the new antibiotic Try vitamin C (orange juice if not diabetic or vitamin C tablets) and drink green tea to help your immune system during your illness Get plenty of rest and hydration Out of work today and tomorrow Please do eat yogurt daily or take a probiotic daily for the next month or two We want to replace the healthy germs in the gut If you notice foul, watery diarrhea in the next two months, schedule an appointment RIGHT AWAY

## 2016-06-06 NOTE — Progress Notes (Signed)
BP 138/86 mmHg  Pulse 101  Temp(Src) 97.6 F (36.4 C) (Oral)  Resp 16  Wt 261 lb (118.389 kg)  SpO2 97%   Subjective:    Patient ID: Nicholas Cabrera, male    DOB: 10-20-1966, 50 y.o.   MRN: [id number]  HPI: Nicholas Cabrera is a 50 y.o. male  Chief Complaint  Patient presents with  . 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; staff at work told him he needed to go to the doctor; pressure on the left side of the face, got down in the filling in her tooth; really stopped up; really weak and drowsy; dozed off at 4 pm at work, went home and took Alka-Setzer cold medicine, then at night Nyquil, got some good sleep, 9 pm to 11 am the next morning; also taking some cold tablets OTC generic to open up; left cheekbone and through the nose; ears are okay; some cough; some postnasal drip; no weird rash; no travel except for the mountains, for relaxation  He has sleep apnea; I put in a referral to pulmonologist on April 25th; I put in another referral to neurology on May 23rd; he has not seen anybody yet; he had one person call him at work and no one called him back  Relevant past medical, surgical, family and social history reviewed Past Medical History  Diagnosis Date  . Vitamin D deficiency   . Low testosterone   . OSA (obstructive sleep apnea)   . Hypothyroidism   . Obesity   . Testicular hypofunction   . Hx of diverticulitis of colon   . Diabetes ([place])   . Hypertension   . Hypothyroidism   . Obesity   . Depression   . Asthma    Social History  Substance Use Topics  . Smoking status: Former Smoker    Quit date: 05/17/2006  . Smokeless tobacco: Never Used  . Alcohol Use: No   Interim medical history since last visit reviewed. Allergies and medications reviewed  Review of Systems Per HPI unless specifically indicated above       Objective:    BP 138/86 mmHg  Pulse 101  Temp(Src) 97.6 F (36.4 C) (Oral)  Resp 16  Wt 261 lb (118.389 kg)  SpO2 97%  Wt Readings from Last 3 Encounters:  06/06/16 261 lb (118.389 kg)  05/21/16 268 lb (121.564 kg)  04/23/16 267 lb 8 oz (121.337 kg)    Physical Exam  Constitutional: He appears well-developed and well-nourished. No distress (but appears to not feel well; voice very congested, occasional cough, clearing of throat).  HENT:  Head: Normocephalic and atraumatic.  Nose: Mucosal edema and rhinorrhea present. No epistaxis. Right sinus exhibits no maxillary sinus tenderness and no frontal sinus tenderness. Left sinus exhibits no maxillary sinus tenderness and no frontal sinus tenderness.  Mouth/Throat: Oropharynx is clear and moist. No oropharyngeal exudate, posterior oropharyngeal edema or posterior oropharyngeal erythema.  Visualization of TMs occluded by cerumen in both canals; no swelling of EACs, no erythema, no drainage; mucosa of nasal passages brightly erythematous  Lymphadenopathy:    He has cervical adenopathy (shoddy left and right AC, submandibular).  Neurological: He is alert.      Assessment & Plan:   Problem List Items Addressed This Visit      Respiratory   Obstructive sleep  apnea    I asked my staff today to check on the referral for his sleep apnea; patient was given appt date/time; I advised patient again to NOT drive if somnolent; I asked him if there is anything else I can do until he sees neurology and he said no; so glad he has lost some weight, as that is going to be one of our targets for this       Other Visit Diagnoses    Acute non-recurrent maxillary sinusitis    -  Primary    I strongly suspect left sided maxillary sinus infection, involving inflammation of the left ETD as well; will start antibiotic; advised risk of C diff; see AVS        Follow up plan: No Follow-up on file.  An after-visit summary was printed and given to the  patient at [place].  Please see the patient instructions which may contain other information and recommendations beyond what is mentioned above in the assessment and plan.  Meds ordered this encounter  Medications  . amoxicillin-clavulanate (AUGMENTIN) 875-125 MG tablet    Sig: Take 1 tablet by mouth 2 (two) times daily.    Dispense:  20 tablet    Refill:  0    No orders of the defined types were placed in this encounter.

## 2016-06-06 NOTE — Assessment & Plan Note (Signed)
I asked my staff today to check on the referral for his sleep apnea; patient was given appt date/time; I advised patient again to NOT drive if somnolent; I asked him if there is anything else I can do until he sees neurology and he said no; so glad he has lost some weight, as that is going to be one of our targets for this

## 2016-06-20 ENCOUNTER — Telehealth: Payer: Self-pay

## 2016-06-20 NOTE — Telephone Encounter (Signed)
Pt wife called is concerned about patients high glucose readings.   Glucose has been in the 300's.  Wants to know what he needs to do?

## 2016-06-20 NOTE — Telephone Encounter (Signed)
I referred him to an endocrinologist in April I hope he is seeing the diabetes specialist by now; if so, please ask him to contact his diabetic specialist If not, please check on that referral and have him increase long-acting daily insulin by five units (so if he's using fifty units once a day, go to fifty-five units once a day) and call us back on Tuesday with readings

## 2016-06-20 NOTE — Telephone Encounter (Signed)
Pt wife  Notified is checking to see if he saw endo?

## 2016-06-30 ENCOUNTER — Telehealth: Payer: Self-pay | Admitting: Family Medicine

## 2016-06-30 DIAGNOSIS — E1165 Type 2 diabetes mellitus with hyperglycemia: Secondary | ICD-10-CM

## 2016-06-30 DIAGNOSIS — Z794 Long term (current) use of insulin: Principal | ICD-10-CM

## 2016-06-30 NOTE — Assessment & Plan Note (Signed)
Re-refer to endocrinologist

## 2016-06-30 NOTE — Telephone Encounter (Signed)
I referred patient to endocrinology in April and thought he was seeing the specialist already; however, I received notification from [facility] that they approved him, and have tried to reach him multiple times and he's not responded Please re-refer; entering NEW referral per [facility]'s request Call patient and let him know that this is very important and ask him to please respond to them about scheduling an appointment

## 2016-07-03 NOTE — Telephone Encounter (Signed)
Called pt and left vm for him to return my call

## 2016-07-04 NOTE — Telephone Encounter (Signed)
Spoke to pt and he stated that he had told you twice that he could not afford the co-pay for a specialist that is why he will not go.

## 2016-07-04 NOTE — Telephone Encounter (Signed)
I'm disappointed that he won't see endocrinologist He has an upcoming appt later this month so we'll discuss and get A1c then

## 2016-07-08 ENCOUNTER — Encounter: Payer: Self-pay | Admitting: Cardiovascular Disease

## 2016-07-25 ENCOUNTER — Ambulatory Visit (INDEPENDENT_AMBULATORY_CARE_PROVIDER_SITE_OTHER): Payer: BC Managed Care – PPO | Admitting: Family Medicine

## 2016-07-25 ENCOUNTER — Encounter: Payer: Self-pay | Admitting: Family Medicine

## 2016-07-25 VITALS — BP 132/90 | HR 81 | Temp 97.8°F | Resp 14 | Wt 266.0 lb

## 2016-07-25 DIAGNOSIS — I10 Essential (primary) hypertension: Secondary | ICD-10-CM

## 2016-07-25 DIAGNOSIS — E1165 Type 2 diabetes mellitus with hyperglycemia: Secondary | ICD-10-CM

## 2016-07-25 DIAGNOSIS — E785 Hyperlipidemia, unspecified: Secondary | ICD-10-CM

## 2016-07-25 DIAGNOSIS — E039 Hypothyroidism, unspecified: Secondary | ICD-10-CM

## 2016-07-25 DIAGNOSIS — Z794 Long term (current) use of insulin: Secondary | ICD-10-CM

## 2016-07-25 LAB — LIPID PANEL
Cholesterol: 171 mg/dL (ref 125–200)
HDL: 40 mg/dL (ref >=40)
LDL CALC: 68 mg/dL (ref <130)
TRIGLYCERIDES: 316 mg/dL — AB (ref <150)
Total CHOL/HDL Ratio: 4.3 Ratio (ref <=5.0)
VLDL: 63 mg/dL — ABNORMAL HIGH (ref <30)

## 2016-07-25 LAB — COMPLETE METABOLIC PANEL WITH GFR
ALBUMIN: 4.2 g/dL (ref 3.6–5.1)
ALT: 27 U/L (ref 9–46)
AST: 18 U/L (ref 10–40)
Alkaline Phosphatase: 55 U/L (ref 40–115)
BILIRUBIN TOTAL: 0.5 mg/dL (ref 0.2–1.2)
BUN: 11 mg/dL (ref 7–25)
CO2: 29 mmol/L (ref 20–31)
CREATININE: 0.7 mg/dL (ref 0.60–1.35)
Calcium: 9.1 mg/dL (ref 8.6–10.3)
Chloride: 98 mmol/L (ref 98–110)
GFR, Est Non African American: >89 mL/min (ref >=60)
Glucose, Bld: 208 mg/dL — ABNORMAL HIGH (ref 65–99)
Potassium: 3.7 mmol/L (ref 3.5–5.3)
Sodium: 139 mmol/L (ref 135–146)
TOTAL PROTEIN: 6.9 g/dL (ref 6.1–8.1)

## 2016-07-25 LAB — HEMOGLOBIN A1C
Hgb A1c MFr Bld: 10.1 % — ABNORMAL HIGH (ref <5.7)
MEAN PLASMA GLUCOSE: 243 mg/dL

## 2016-07-25 LAB — MAGNESIUM: MAGNESIUM: 1.6 mg/dL (ref 1.5–2.5)

## 2016-07-25 MED ORDER — FLUTICASONE PROPIONATE 50 MCG/ACT NA SUSP: 2.0000 | Freq: Every day | NASAL | 11 refills | Status: AC

## 2016-07-25 NOTE — Assessment & Plan Note (Signed)
Check lipids; cut out saturated fats

## 2016-07-25 NOTE — Assessment & Plan Note (Signed)
Fair control, keep up the weight loss and salt avoidance; DASH guidelines; continue four meds

## 2016-07-25 NOTE — Assessment & Plan Note (Signed)
Check Mg2+ 

## 2016-07-25 NOTE — Assessment & Plan Note (Signed)
Check A1c; foot exam by MD today; increase insulin by five units daily

## 2016-07-25 NOTE — Patient Instructions (Addendum)
You can use nasal spray up to 3 days, then take off at least 2 weeks, otherwise you can get addicted to it; can theorhetically raise your blood pressure  I do recommend B12 sublingual (SL) 500 or 1000 or 2000 mcg daily  Let's get labs today  Increase your Lantus from fifty to fifty-five units daily  Keep up the good work on weight loss

## 2016-07-25 NOTE — Assessment & Plan Note (Signed)
TSH in April was normal

## 2016-07-25 NOTE — Progress Notes (Signed)
BP 132/90   Pulse 81   Temp 97.8 F (36.6 C) (Oral)   Resp 14   Wt 266 lb (120.7 kg)   SpO2 96%   BMI 39.28 kg/m    Subjective:    Patient ID: Nicholas Cabrera, male    DOB: 1966/12/17, 50 y.o.   MRN: [id number]  HPI: Nicholas Cabrera is a 50 y.o. male  Chief Complaint  Patient presents with  . Follow-up   Patient is here for follow-up; type 2 diabetes; he has not gone to see the endocrinologist; he says it is a $100 copay and he can't pay that In March, [place] quit paying for Lantus and he switched to Levemir; we changed his dose He switched back to Lantus and he has been fine; he was having headaches on the Levemir, blurred vision, felt tired Sugars are up and down; can't get them under 200; they were 300-500 on the Levemir, much better on the Lantus Yesterday morning it was 227 fasting Blood pressure; on four BP meds; not adding salt; I asked about decongesants; uses daily nasal spray sometimes, mists up in the nose, compared to Afrin High cholesterol; on statin Some ear wax, just wants ears checked He asked about getting B12 shots for energy  Depression screen [facility] 2/9 07/25/2016 05/21/2016 04/23/2016  Decreased Interest 0 0 0  Down, Depressed, Hopeless 0 0 0  PHQ - 2 Score 0 0 0   Relevant past medical, surgical, family and social history reviewed Past Medical History:  Diagnosis Date  . Asthma   . Depression   . Diabetes ([place])   . Hx of diverticulitis of colon   . Hypertension   . Hypothyroidism   . Hypothyroidism   . Low testosterone   . Obesity   . Obesity   . OSA (obstructive sleep apnea)   . Testicular hypofunction   . Vitamin D deficiency    Past Surgical History:  Procedure Laterality Date  . COLONOSCOPY    . POLYPECTOMY     Family History  Problem Relation Age of Onset  . Hypertension Mother   . Stroke Father   . Hypertension Father   . Diabetes Father   . Glaucoma Father   . Heart disease Maternal Grandfather   . Stroke Paternal Grandmother     . Diabetes Paternal Grandmother   . Diabetes Paternal Grandfather   . Lung disease Paternal Grandfather   . Cancer Maternal Uncle     lung   Social History  Substance Use Topics  . Smoking status: Former Smoker    Quit date: 05/17/2006  . Smokeless tobacco: Never Used  . Alcohol use No    Interim medical history since last visit reviewed. Allergies and medications reviewed  Review of Systems Per HPI unless specifically indicated above     Objective:    BP 132/90   Pulse 81   Temp 97.8 F (36.6 C) (Oral)   Resp 14   Wt 266 lb (120.7 kg)   SpO2 96%   BMI 39.28 kg/m   Wt Readings from Last 3 Encounters:  07/25/16 266 lb (120.7 kg)  06/06/16 261 lb (118.4 kg)  05/21/16 268 lb (121.6 kg)    Physical Exam  Constitutional: He appears well-developed and well-nourished. No distress.  oebse  HENT:  Head: Normocephalic and atraumatic.  Right Ear: Hearing and external ear normal.  Left Ear: Hearing and external ear normal.  Some cerumen in both canals, not impacted  Eyes: EOM are  normal. No scleral icterus.  Neck: No thyromegaly present.  Cardiovascular: Normal rate and regular rhythm.   Pulmonary/Chest: Effort normal and breath sounds normal.  Abdominal: Soft. Bowel sounds are normal. He exhibits no distension.  Musculoskeletal: He exhibits no edema.  Neurological: Coordination normal.  Skin: Skin is warm and dry. No pallor.  Psychiatric: He has a normal mood and affect. His behavior is normal. Judgment and thought content normal.   Diabetic Foot Form - Detailed   Diabetic Foot Exam - detailed Diabetic Foot exam was performed with the following findings:  Yes 07/25/2016  8:49 AM  Visual Foot Exam completed.:  Yes  Are the toenails ingrown?:  No Normal Range of Motion:  Yes Pulse Foot Exam completed.:  Yes  Right Dorsalis Pedis:  Present Left Dorsalis Pedis:  Present  Sensory Foot Exam Completed.:  Yes Swelling:  No Semmes-Weinstein Monofilament Test R Site  1-Great Toe:  Neg L Site 1-Great Toe:  Neg  R Site 4:  Pos L Site 4:  Pos  R Site 5:  Pos L Site 5:  Pos    Comments:  Diminished sensation both great toes and balls of feet; high arches     Results for orders placed or performed during the hospital encounter of 05/21/16  VITAMIN D 25 Hydroxy (Vit-D Deficiency, Fractures)  Result Value Ref Range   Vit D, 25-Hydroxy 25.9 (L) 30.0 - 100.0 ng/mL  Testosterone,Free and Total  Result Value Ref Range   Testosterone 154 (L) 348 - 1,197 ng/dL   Comment, Testosterone Comment    Testosterone, Free 5.6 (L) 6.8 - 21.5 pg/mL  PSA  Result Value Ref Range   PSA 0.62 0.00 - 4.00 ng/mL  Magnesium  Result Value Ref Range   Magnesium 1.6 (L) 1.7 - 2.4 mg/dL  Basic metabolic panel  Result Value Ref Range   Sodium 133 (L) 135 - 145 mmol/L   Potassium 3.6 3.5 - 5.1 mmol/L   Chloride 96 (L) 101 - 111 mmol/L   CO2 28 22 - 32 mmol/L   Glucose, Bld 227 (H) 65 - 99 mg/dL   BUN 12 6 - 20 mg/dL   Creatinine, Ser 0.61 0.61 - 1.24 mg/dL   Calcium 9.2 8.9 - 10.3 mg/dL   GFR calc non Af Amer >60 >60 mL/min   GFR calc Af Amer >60 >60 mL/min   Anion gap 9 5 - 15      Assessment & Plan:   Problem List Items Addressed This Visit      Cardiovascular and Mediastinum   Hypertension (Chronic)    Fair control, keep up the weight loss and salt avoidance; DASH guidelines; continue four meds        Endocrine   Type 2 diabetes mellitus, uncontrolled (HCC) (Chronic)    Check A1c; foot exam by MD today; increase insulin by five units daily      Relevant Medications   LANTUS SOLOSTAR 100 UNIT/ML Solostar Pen   Other Relevant Orders   Hemoglobin A1c   COMPLETE METABOLIC PANEL WITH GFR   Hypothyroidism (Chronic)    TSH in April was normal        Other   Hypomagnesemia    Check Mg2+      Relevant Orders   Magnesium   Hyperlipidemia (Chronic)    Check lipids; cut out saturated fats      Relevant Orders   Lipid panel    Other Visit Diagnoses     None.  Follow up plan: Return in about 3 months (around 10/25/2016) for follow-up and fasting labs.  An after-visit summary was printed and given to the patient at [place].  Please see the patient instructions which may contain other information and recommendations beyond what is mentioned above in the assessment and plan.  Meds ordered this encounter  Medications  . LANTUS SOLOSTAR 100 UNIT/ML Solostar Pen    Sig: Inject 55 Units into the skin at bedtime.  . fluticasone (FLONASE) 50 MCG/ACT nasal spray    Sig: Place 2 sprays into both nostrils daily.    Dispense:  16 g    Refill:  11    Orders Placed This Encounter  Procedures  . Hemoglobin A1c  . COMPLETE METABOLIC PANEL WITH GFR  . Lipid panel  . Magnesium

## 2016-07-29 ENCOUNTER — Other Ambulatory Visit: Payer: Self-pay

## 2016-07-29 DIAGNOSIS — I10 Essential (primary) hypertension: Secondary | ICD-10-CM

## 2016-07-29 DIAGNOSIS — E039 Hypothyroidism, unspecified: Secondary | ICD-10-CM

## 2016-07-29 DIAGNOSIS — IMO0001 Reserved for inherently not codable concepts without codable children: Secondary | ICD-10-CM

## 2016-07-29 DIAGNOSIS — E1165 Type 2 diabetes mellitus with hyperglycemia: Secondary | ICD-10-CM

## 2016-07-30 MED ORDER — CHLORTHALIDONE 25 MG PO TABS: 25.0000 mg | ORAL_TABLET | Freq: Every day | ORAL | 6 refills | Status: AC

## 2016-07-30 MED ORDER — LEVOTHYROXINE SODIUM 75 MCG PO TABS: 75.0000 ug | ORAL_TABLET | Freq: Every day | ORAL | 6 refills | Status: AC

## 2016-07-30 MED ORDER — METOPROLOL SUCCINATE ER 50 MG PO TB24: 50.0000 mg | ORAL_TABLET | Freq: Every day | ORAL | 6 refills | Status: AC

## 2016-07-30 MED ORDER — LISINOPRIL 40 MG PO TABS: 40.0000 mg | ORAL_TABLET | Freq: Every day | ORAL | 6 refills | Status: DC

## 2016-07-30 NOTE — Telephone Encounter (Signed)
Labs reviewed; Rxs approved 

## 2016-08-27 ENCOUNTER — Emergency Department: Payer: BC Managed Care – PPO

## 2016-08-27 ENCOUNTER — Emergency Department
Admission: EM | Admit: 2016-08-27 | Discharge: 2016-08-27 | Disposition: A | Discharge status: Home / Self Care | Payer: BC Managed Care – PPO | Attending: Emergency Medicine | Admitting: Emergency Medicine

## 2016-08-27 ENCOUNTER — Encounter: Payer: Self-pay | Admitting: Emergency Medicine

## 2016-08-27 DIAGNOSIS — I10 Essential (primary) hypertension: Secondary | ICD-10-CM | POA: Insufficient documentation

## 2016-08-27 DIAGNOSIS — E039 Hypothyroidism, unspecified: Secondary | ICD-10-CM | POA: Diagnosis not present

## 2016-08-27 DIAGNOSIS — E119 Type 2 diabetes mellitus without complications: Secondary | ICD-10-CM | POA: Diagnosis not present

## 2016-08-27 DIAGNOSIS — Z794 Long term (current) use of insulin: Secondary | ICD-10-CM | POA: Insufficient documentation

## 2016-08-27 DIAGNOSIS — R1030 Lower abdominal pain, unspecified: Secondary | ICD-10-CM | POA: Diagnosis present

## 2016-08-27 DIAGNOSIS — Z87891 Personal history of nicotine dependence: Secondary | ICD-10-CM | POA: Insufficient documentation

## 2016-08-27 DIAGNOSIS — Z79899 Other long term (current) drug therapy: Secondary | ICD-10-CM | POA: Insufficient documentation

## 2016-08-27 DIAGNOSIS — K5732 Diverticulitis of large intestine without perforation or abscess without bleeding: Secondary | ICD-10-CM | POA: Insufficient documentation

## 2016-08-27 DIAGNOSIS — Z7982 Long term (current) use of aspirin: Secondary | ICD-10-CM | POA: Diagnosis not present

## 2016-08-27 DIAGNOSIS — J45909 Unspecified asthma, uncomplicated: Secondary | ICD-10-CM | POA: Insufficient documentation

## 2016-08-27 DIAGNOSIS — K5792 Diverticulitis of intestine, part unspecified, without perforation or abscess without bleeding: Secondary | ICD-10-CM

## 2016-08-27 LAB — URINALYSIS COMPLETE WITH MICROSCOPIC (ARMC ONLY)
Bacteria, UA: NONE SEEN
Bilirubin Urine: NEGATIVE
Glucose, UA: 150 mg/dL — AB
Hgb urine dipstick: NEGATIVE
Ketones, ur: NEGATIVE mg/dL
Leukocytes, UA: NEGATIVE
Nitrite: NEGATIVE
Protein, ur: NEGATIVE mg/dL
Specific Gravity, Urine: 1.025 (ref 1.005–1.030)
Squamous Epithelial / HPF: NONE SEEN
WBC, UA: NONE SEEN WBC/hpf (ref 0–5)
pH: 5 (ref 5.0–8.0)

## 2016-08-27 LAB — CBC
HCT: 43.5 % (ref 40.0–52.0)
Hemoglobin: 15.1 g/dL (ref 13.0–18.0)
MCH: 29.6 pg (ref 26.0–34.0)
MCHC: 34.7 g/dL (ref 32.0–36.0)
MCV: 85.2 fL (ref 80.0–100.0)
PLATELETS: 248 10*3/uL (ref 150–440)
RBC: 5.11 MIL/uL (ref 4.40–5.90)
RDW: 12.7 % (ref 11.5–14.5)
WBC: 14.3 10*3/uL — ABNORMAL HIGH (ref 3.8–10.6)

## 2016-08-27 LAB — COMPREHENSIVE METABOLIC PANEL
ALK PHOS: 52 U/L (ref 38–126)
ALT: 21 U/L (ref 17–63)
AST: 19 U/L (ref 15–41)
Albumin: 3.5 g/dL (ref 3.5–5.0)
Anion gap: 8 (ref 5–15)
BUN: 9 mg/dL (ref 6–20)
CALCIUM: 8.7 mg/dL — AB (ref 8.9–10.3)
CHLORIDE: 97 mmol/L — AB (ref 101–111)
CO2: 28 mmol/L (ref 22–32)
CREATININE: 0.74 mg/dL (ref 0.61–1.24)
GFR calc non Af Amer: >60 mL/min (ref >60)
Glucose, Bld: 281 mg/dL — ABNORMAL HIGH (ref 65–99)
Potassium: 3 mmol/L — ABNORMAL LOW (ref 3.5–5.1)
SODIUM: 133 mmol/L — AB (ref 135–145)
Total Bilirubin: 0.9 mg/dL (ref 0.3–1.2)
Total Protein: 7.3 g/dL (ref 6.5–8.1)

## 2016-08-27 LAB — LIPASE, BLOOD: LIPASE: 26 U/L (ref 11–51)

## 2016-08-27 MED ORDER — TRAMADOL HCL 50 MG PO TABS: 50.0000 mg | ORAL_TABLET | Freq: Four times a day (QID) | ORAL | 0 refills | Status: DC | PRN

## 2016-08-27 MED ORDER — SODIUM CHLORIDE 0.9 % IV BOLUS (SEPSIS)
1000.0000 mL | Freq: Once | INTRAVENOUS | Status: AC
  Administered 2016-08-27: 1000 mL via INTRAVENOUS

## 2016-08-27 MED ORDER — CIPROFLOXACIN HCL 500 MG PO TABS: 500.0000 mg | ORAL_TABLET | Freq: Two times a day (BID) | ORAL | 0 refills | Status: AC

## 2016-08-27 MED ORDER — ONDANSETRON HCL 4 MG/2ML IJ SOLN
4.0000 mg | Freq: Once | INTRAMUSCULAR | Status: AC
  Administered 2016-08-27: 4 mg via INTRAVENOUS
  Filled 2016-08-27: qty 2

## 2016-08-27 MED ORDER — CIPROFLOXACIN HCL 500 MG PO TABS
ORAL_TABLET | ORAL | Status: AC
  Administered 2016-08-27: 500 mg via ORAL
  Filled 2016-08-27: qty 1

## 2016-08-27 MED ORDER — IOPAMIDOL (ISOVUE-300) INJECTION 61%
100.0000 mL | Freq: Once | INTRAVENOUS | Status: AC | PRN
  Administered 2016-08-27: 100 mL via INTRAVENOUS
  Filled 2016-08-27: qty 100

## 2016-08-27 MED ORDER — CIPROFLOXACIN HCL 500 MG PO TABS
500.0000 mg | ORAL_TABLET | Freq: Once | ORAL | Status: AC
  Administered 2016-08-27: 500 mg via ORAL

## 2016-08-27 MED ORDER — DIATRIZOATE MEGLUMINE & SODIUM 66-10 % PO SOLN
15.0000 mL | Freq: Once | ORAL | Status: AC
  Administered 2016-08-27: 15 mL via ORAL

## 2016-08-27 MED ORDER — METRONIDAZOLE 500 MG PO TABS
ORAL_TABLET | ORAL | Status: AC
  Administered 2016-08-27: 500 mg via ORAL
  Filled 2016-08-27: qty 1

## 2016-08-27 MED ORDER — MORPHINE SULFATE (PF) 4 MG/ML IV SOLN
4.0000 mg | Freq: Once | INTRAVENOUS | Status: DC
  Filled 2016-08-27: qty 1

## 2016-08-27 MED ORDER — METRONIDAZOLE 500 MG PO TABS
500.0000 mg | ORAL_TABLET | Freq: Once | ORAL | Status: AC
  Administered 2016-08-27: 500 mg via ORAL

## 2016-08-27 MED ORDER — METRONIDAZOLE 500 MG PO TABS: 500.0000 mg | ORAL_TABLET | Freq: Three times a day (TID) | ORAL | 0 refills | Status: AC

## 2016-08-27 NOTE — ED Notes (Signed)
Pt discharged to home.  Family member driving.  Discharge instructions reviewed.  Verbalized understanding.  No questions or concerns at this time.  Teach back verified.  Pt in NAD.  No items left in ED.   

## 2016-08-27 NOTE — ED Provider Notes (Signed)
[facility] Emergency Department Provider Note   ____________________________________________   I have reviewed the triage vital signs and the nursing notes.   HISTORY  Chief Complaint Abdominal Pain   History limited by: Not Limited   HPI Nicholas Cabrera is a 50 y.o. male who presents to the emergency department today because of concerns for abdominal pain. It started 5 days ago. It was initially located in the lower abdomen but is now also migrated up into the upper abdomen. It will occasionally be sharp however at the time my examination is more of a dull pain. He initially had diarrhea with the pain and took some Imodium. He then became constipated for a few days and took a laxative. He has now been having diarrhea again. He has not noticed any blood in his output. He feels like he had fevers last night and had chills. He denies any vomiting. He does have a history of diverticulitis and thinks that this is similar.   Past Medical History:  Diagnosis Date  . Asthma   . Depression   . Diabetes ([place])   . Hx of diverticulitis of colon   . Hypertension   . Hypothyroidism   . Hypothyroidism   . Low testosterone   . Obesity   . Obesity   . OSA (obstructive sleep apnea)   . Testicular hypofunction   . Vitamin D deficiency     Patient Active Problem List   Diagnosis Date Noted  . Hypomagnesemia 05/21/2016  . Fatigue 05/21/2016  . Hypogonadism in male 05/21/2016  . Vitamin D deficiency 05/21/2016  . Erectile dysfunction 05/21/2016  . Poor circulation of extremity ([place]) 04/24/2016  . Diverticulitis 08/12/2015  . Obstructive sleep apnea 07/01/2015  . Type 2 diabetes mellitus, uncontrolled ([place])   . Hypertension   . Hyperlipidemia   . Hypothyroidism   . Obesity   . Depression     Past Surgical History:  Procedure Laterality Date  . COLONOSCOPY    . POLYPECTOMY      Prior to Admission medications   Medication Sig Start Date End Date Taking?  Authorizing Provider  amLODipine (NORVASC) 10 MG tablet Take 1 tablet (10 mg total) by mouth daily. 06/29/15   Arnetha Courser, MD  aspirin 81 MG tablet Take 81 mg by mouth daily.    Historical Provider, MD  atorvastatin (LIPITOR) 10 MG tablet Take 1 tablet (10 mg total) by mouth at bedtime. For cholesterol 03/22/16   Arnetha Courser, MD  BAYER CONTOUR TEST test strip Check FSBS three times per day; dx 11.65 07/07/15   Arnetha Courser, MD  chlorthalidone (HYGROTON) 25 MG tablet Take 1 tablet (25 mg total) by mouth daily. 07/30/16   Arnetha Courser, MD  Cholecalciferol (VITAMIN D) 2000 UNITS CAPS Take 2,000 Units by mouth daily.    Historical Provider, MD  Cinnamon ([facility] CINNAMON) 500 MG capsule Take 500 mg by mouth 2 (two) times daily.    Historical Provider, MD  fluticasone (FLONASE) 50 MCG/ACT nasal spray Place 2 sprays into both nostrils daily. 07/25/16   Arnetha Courser, MD  Insulin Pen Needle (CLICKFINE PEN NEEDLES) 31G X 8 MM MISC 1 each by Does not apply route daily. 04/23/16   Arnetha Courser, MD  LANTUS SOLOSTAR 100 UNIT/ML Solostar Pen Inject 55 Units into the skin at bedtime. 07/23/16   Historical Provider, MD  levothyroxine (SYNTHROID, LEVOTHROID) 75 MCG tablet Take 1 tablet (75 mcg total) by mouth daily before breakfast.  07/30/16   Arnetha Courser, MD  lisinopril (PRINIVIL,ZESTRIL) 40 MG tablet Take 1 tablet (40 mg total) by mouth daily. 07/30/16   Arnetha Courser, MD  Magnesium 500 MG TABS Take 1 tablet (500 mg total) by mouth 2 (two) times daily. 05/21/16   Arnetha Courser, MD  metoprolol succinate (TOPROL-XL) 50 MG 24 hr tablet Take 1 tablet (50 mg total) by mouth daily. Take with or immediately following a meal. 07/30/16   Arnetha Courser, MD  sitaGLIPtin-metformin (JANUMET) 50-1000 MG per tablet Take 1 tablet by mouth 2 (two) times daily with a meal. 06/29/15   Arnetha Courser, MD  vitamin B-12 (CYANOCOBALAMIN) 1000 MCG tablet Take 1,500 mcg by mouth daily.    Historical Provider, MD    Allergies Tussionex  pennkinetic er [hydrocod polst-cpm polst er]  Family History  Problem Relation Age of Onset  . Hypertension Mother   . Stroke Father   . Hypertension Father   . Diabetes Father   . Glaucoma Father   . Heart disease Maternal Grandfather   . Stroke Paternal Grandmother   . Diabetes Paternal Grandmother   . Diabetes Paternal Grandfather   . Lung disease Paternal Grandfather   . Cancer Maternal Uncle     lung    Social History Social History  Substance Use Topics  . Smoking status: Former Smoker    Quit date: 05/17/2006  . Smokeless tobacco: Never Used  . Alcohol use No    Review of Systems  Constitutional: Positive for fevers and chills. Cardiovascular: Negative for chest pain. Respiratory: Negative for shortness of breath. Gastrointestinal: Positive for abdominal pain. Neurological: Negative for headaches, focal weakness or numbness.  10-point ROS otherwise negative.  ____________________________________________   PHYSICAL EXAM:  VITAL SIGNS: ED Triage Vitals  Enc Vitals Group     BP 08/27/16 1247 139/88     Pulse Rate 08/27/16 1247 95     Resp 08/27/16 1247 16     Temp 08/27/16 1247 98.7 F (37.1 C)     Temp Source 08/27/16 1247 Oral     SpO2 08/27/16 1247 97 %     Weight 08/27/16 1248 269 lb (122 kg)     Height 08/27/16 1248 5\' 9"  (1.753 m)     Head Circumference --      Peak Flow --      Pain Score 08/27/16 1248 4   Constitutional: Alert and oriented. Well appearing and in no distress. Eyes: Conjunctivae are normal. Normal extraocular movements. ENT   Head: Normocephalic and atraumatic.   Nose: No congestion/rhinnorhea.   Mouth/Throat: Mucous membranes are moist.   Neck: No stridor. Hematological/Lymphatic/Immunilogical: No cervical lymphadenopathy. Cardiovascular: Normal rate, regular rhythm.  No murmurs, rubs, or gallops. Respiratory: Normal respiratory effort without tachypnea nor retractions. Breath sounds are clear and equal  bilaterally. No wheezes/rales/rhonchi. Gastrointestinal: Soft and tender to palpation somewhat diffusely but slightly worse in the lower abdomen. No CVA tenderness. No rebound. No guarding.  Genitourinary: Deferred Musculoskeletal: Normal range of motion in all extremities. No lower extremity edema. Neurologic:  Normal speech and language. No gross focal neurologic deficits are appreciated.  Skin:  Skin is warm, dry and intact. No rash noted. Psychiatric: Mood and affect are normal. Speech and behavior are normal. Patient exhibits appropriate insight and judgment.  ____________________________________________    LABS (pertinent positives/negatives)  Labs Reviewed  COMPREHENSIVE METABOLIC PANEL - Abnormal; Notable for the following:       Result Value   Sodium 133 (*)  Potassium 3.0 (*)    Chloride 97 (*)    Glucose, Bld 281 (*)    Calcium 8.7 (*)    All other components within normal limits  CBC - Abnormal; Notable for the following:    WBC 14.3 (*)    All other components within normal limits  URINALYSIS COMPLETEWITH MICROSCOPIC ([facility] ONLY) - Abnormal; Notable for the following:    Color, Urine YELLOW (*)    APPearance CLEAR (*)    Ketones, ur TRACE (*)    Hgb urine dipstick 3+ (*)    Leukocytes, UA TRACE (*)    Squamous Epithelial / LPF 0-5 (*)    All other components within normal limits  LIPASE, BLOOD     ____________________________________________   EKG  None  ____________________________________________    RADIOLOGY  CT abd/pel IMPRESSION:  Acute sigmoid diverticulitis. No evidence for perforation or abscess  at this time.      ____________________________________________   PROCEDURES  Procedures  ____________________________________________   INITIAL IMPRESSION / ASSESSMENT AND PLAN / ED COURSE  Pertinent labs & imaging results that were available during my care of the patient were reviewed by me and considered in my medical decision  making (see chart for details).  Patient presents to the emergency department today because of concerns for abdominal pain. Patient does have a mild leukocytosis. Will check CT scan to evaluate for possible diverticulitis.  Clinical Course   CT scan shows diverticulitis without signs of abscess or perforation. Will give first dose of antibiotics here. Will discharge with cipro and flagyl. Discussed return precautions. ____________________________________________   FINAL CLINICAL IMPRESSION(S) / ED DIAGNOSES  Final diagnoses:  Diverticulitis of intestine without perforation or abscess without bleeding     Note: This dictation was prepared with Dragon dictation. Any transcriptional errors that result from this process are unintentional    Nance Pear, MD 08/27/16 1956

## 2016-08-27 NOTE — Discharge Instructions (Signed)
Please seek medical attention for any high fevers, chest pain, shortness of breath, change in behavior, persistent vomiting, bloody stool or any other new or concerning symptoms.  

## 2016-08-27 NOTE — ED Notes (Signed)
Spoke with Lab. They notified me the wrong urine was resulted on this patient at 13:13. MD notified

## 2016-08-27 NOTE — ED Triage Notes (Signed)
"  I think my diverticulitis has kicked in" C/O abdominal pain x 5 days.  Diarrhea on Saturday and took immodium.  No BM x 3 days and took a laxative with small results.  Started with chills last night.

## 2016-09-10 ENCOUNTER — Telehealth: Payer: Self-pay | Admitting: Family Medicine

## 2016-09-10 NOTE — Telephone Encounter (Signed)
Called pharmacy he should not be out

## 2016-09-18 ENCOUNTER — Other Ambulatory Visit: Payer: Self-pay | Admitting: Family Medicine

## 2016-09-18 NOTE — Telephone Encounter (Signed)
Refill approved.

## 2016-09-25 ENCOUNTER — Telehealth: Payer: Self-pay | Admitting: Family Medicine

## 2016-09-25 NOTE — Telephone Encounter (Signed)
I don't see upcoming appt for patient Last visit late July He needs a visit for late October please, 3 months after last visit He'll get fasting labs at that visit His insurance company doesn't think he is taking his statin If he needs refills, all he has to do is ask

## 2016-09-25 NOTE — Telephone Encounter (Signed)
Spoke with patient and he has enough for 30 days. He will call back to schedule the appointment.

## 2016-10-17 ENCOUNTER — Ambulatory Visit: Payer: BC Managed Care – PPO | Attending: Specialist

## 2016-10-17 DIAGNOSIS — G4761 Periodic limb movement disorder: Secondary | ICD-10-CM | POA: Insufficient documentation

## 2016-10-17 DIAGNOSIS — R0683 Snoring: Secondary | ICD-10-CM | POA: Insufficient documentation

## 2016-10-17 DIAGNOSIS — R51 Headache: Secondary | ICD-10-CM | POA: Diagnosis present

## 2016-10-17 DIAGNOSIS — G4710 Hypersomnia, unspecified: Secondary | ICD-10-CM | POA: Diagnosis present

## 2016-10-17 DIAGNOSIS — G4733 Obstructive sleep apnea (adult) (pediatric): Secondary | ICD-10-CM | POA: Insufficient documentation

## 2016-10-29 ENCOUNTER — Telehealth: Payer: Self-pay | Admitting: Family Medicine

## 2016-10-29 MED ORDER — ATORVASTATIN CALCIUM 10 MG PO TABS: 10.0000 mg | ORAL_TABLET | Freq: Every day | ORAL | 0 refills | Status: DC

## 2016-10-29 NOTE — Telephone Encounter (Signed)
Please contact patient He was actually due to be seen for a visit with fasting labs around October 27th He does not have an appt scheduled Please ask him to schedule first available Also, his insurance company sent a note that he's not been taking the atorvastatin; I'll send a refill if he'll please get back on it We look forward to seeing him soon

## 2016-10-30 NOTE — Telephone Encounter (Signed)
LMOM for pt to call the office °

## 2016-12-04 ENCOUNTER — Ambulatory Visit: Payer: BC Managed Care – PPO | Attending: Specialist

## 2016-12-04 DIAGNOSIS — G4761 Periodic limb movement disorder: Secondary | ICD-10-CM | POA: Diagnosis not present

## 2016-12-04 DIAGNOSIS — G4733 Obstructive sleep apnea (adult) (pediatric): Secondary | ICD-10-CM | POA: Diagnosis present

## 2017-02-03 ENCOUNTER — Other Ambulatory Visit: Payer: Self-pay | Admitting: Family Medicine

## 2017-02-03 MED ORDER — OSELTAMIVIR PHOSPHATE 75 MG PO CAPS: 75.0000 mg | ORAL_CAPSULE | Freq: Every day | ORAL | 0 refills | Status: DC

## 2017-06-01 ENCOUNTER — Other Ambulatory Visit: Payer: Self-pay | Admitting: Family Medicine

## 2017-06-01 DIAGNOSIS — I10 Essential (primary) hypertension: Secondary | ICD-10-CM

## 2017-06-01 DIAGNOSIS — E039 Hypothyroidism, unspecified: Secondary | ICD-10-CM

## 2017-08-05 ENCOUNTER — Other Ambulatory Visit: Payer: Self-pay | Admitting: Family Medicine

## 2017-10-19 ENCOUNTER — Emergency Department: Payer: BC Managed Care – PPO

## 2017-10-19 DIAGNOSIS — J45909 Unspecified asthma, uncomplicated: Secondary | ICD-10-CM | POA: Insufficient documentation

## 2017-10-19 DIAGNOSIS — E119 Type 2 diabetes mellitus without complications: Secondary | ICD-10-CM | POA: Diagnosis not present

## 2017-10-19 DIAGNOSIS — S0990XA Unspecified injury of head, initial encounter: Secondary | ICD-10-CM | POA: Diagnosis present

## 2017-10-19 DIAGNOSIS — S62337A Displaced fracture of neck of fifth metacarpal bone, left hand, initial encounter for closed fracture: Secondary | ICD-10-CM | POA: Insufficient documentation

## 2017-10-19 DIAGNOSIS — Y999 Unspecified external cause status: Secondary | ICD-10-CM | POA: Insufficient documentation

## 2017-10-19 DIAGNOSIS — Z79899 Other long term (current) drug therapy: Secondary | ICD-10-CM | POA: Diagnosis not present

## 2017-10-19 DIAGNOSIS — Y92411 Interstate highway as the place of occurrence of the external cause: Secondary | ICD-10-CM | POA: Insufficient documentation

## 2017-10-19 DIAGNOSIS — Z794 Long term (current) use of insulin: Secondary | ICD-10-CM | POA: Insufficient documentation

## 2017-10-19 DIAGNOSIS — S060X9A Concussion with loss of consciousness of unspecified duration, initial encounter: Secondary | ICD-10-CM | POA: Diagnosis not present

## 2017-10-19 DIAGNOSIS — Z7982 Long term (current) use of aspirin: Secondary | ICD-10-CM | POA: Insufficient documentation

## 2017-10-19 DIAGNOSIS — R1011 Right upper quadrant pain: Secondary | ICD-10-CM | POA: Diagnosis not present

## 2017-10-19 DIAGNOSIS — Z87891 Personal history of nicotine dependence: Secondary | ICD-10-CM | POA: Diagnosis not present

## 2017-10-19 DIAGNOSIS — E039 Hypothyroidism, unspecified: Secondary | ICD-10-CM | POA: Insufficient documentation

## 2017-10-19 DIAGNOSIS — Y939 Activity, unspecified: Secondary | ICD-10-CM | POA: Diagnosis not present

## 2017-10-19 DIAGNOSIS — I10 Essential (primary) hypertension: Secondary | ICD-10-CM | POA: Insufficient documentation

## 2017-10-19 MED ORDER — IBUPROFEN 800 MG PO TABS
ORAL_TABLET | ORAL | Status: AC
  Filled 2017-10-19: qty 1

## 2017-10-19 MED ORDER — IBUPROFEN 800 MG PO TABS
800.0000 mg | ORAL_TABLET | Freq: Once | ORAL | Status: AC
  Administered 2017-10-20: 800 mg via ORAL
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
## 2017-10-20 ENCOUNTER — Emergency Department: Payer: BC Managed Care – PPO

## 2017-10-20 ENCOUNTER — Emergency Department
Admission: EM | Admit: 2017-10-20 | Discharge: 2017-10-20 | Disposition: A | Discharge status: Home / Self Care | Payer: BC Managed Care – PPO | Attending: Emergency Medicine | Admitting: Emergency Medicine

## 2017-10-20 ENCOUNTER — Encounter: Payer: Self-pay | Admitting: Radiology

## 2017-10-20 DIAGNOSIS — S0990XA Unspecified injury of head, initial encounter: Secondary | ICD-10-CM

## 2017-10-20 DIAGNOSIS — S62337A Displaced fracture of neck of fifth metacarpal bone, left hand, initial encounter for closed fracture: Secondary | ICD-10-CM

## 2017-10-20 LAB — COMPREHENSIVE METABOLIC PANEL
ALBUMIN: 4 g/dL (ref 3.5–5.0)
ALT: 27 U/L (ref 17–63)
AST: 28 U/L (ref 15–41)
Alkaline Phosphatase: 42 U/L (ref 38–126)
Anion gap: 12 (ref 5–15)
BUN: 14 mg/dL (ref 6–20)
CHLORIDE: 96 mmol/L — AB (ref 101–111)
CO2: 27 mmol/L (ref 22–32)
CREATININE: 1.01 mg/dL (ref 0.61–1.24)
Calcium: 9.1 mg/dL (ref 8.9–10.3)
GFR calc non Af Amer: >60 mL/min (ref >60)
GLUCOSE: 162 mg/dL — AB (ref 65–99)
Potassium: 3.2 mmol/L — ABNORMAL LOW (ref 3.5–5.1)
Sodium: 135 mmol/L (ref 135–145)
Total Bilirubin: 0.5 mg/dL (ref 0.3–1.2)
Total Protein: 7.3 g/dL (ref 6.5–8.1)

## 2017-10-20 LAB — CBC
HEMATOCRIT: 47.1 % (ref 40.0–52.0)
HEMOGLOBIN: 15.8 g/dL (ref 13.0–18.0)
MCH: 29.2 pg (ref 26.0–34.0)
MCHC: 33.6 g/dL (ref 32.0–36.0)
MCV: 86.8 fL (ref 80.0–100.0)
Platelets: 294 10*3/uL (ref 150–440)
RBC: 5.43 MIL/uL (ref 4.40–5.90)
RDW: 13.5 % (ref 11.5–14.5)
WBC: 14.8 10*3/uL — ABNORMAL HIGH (ref 3.8–10.6)

## 2017-10-20 MED ORDER — ONDANSETRON HCL 4 MG/2ML IJ SOLN
4.0000 mg | Freq: Once | INTRAMUSCULAR | Status: AC
  Administered 2017-10-20: 4 mg via INTRAVENOUS
  Filled 2017-10-20: qty 2

## 2017-10-20 MED ORDER — OXYCODONE-ACETAMINOPHEN 5-325 MG PO TABS: 1.0000 | ORAL_TABLET | ORAL | 0 refills | Status: DC | PRN

## 2017-10-20 MED ORDER — IOPAMIDOL (ISOVUE-300) INJECTION 61%
100.0000 mL | Freq: Once | INTRAVENOUS | Status: AC | PRN
  Administered 2017-10-20: 100 mL via INTRAVENOUS

## 2017-10-20 MED ORDER — MORPHINE SULFATE (PF) 2 MG/ML IV SOLN
2.0000 mg | Freq: Once | INTRAVENOUS | Status: AC
  Administered 2017-10-20: 2 mg via INTRAVENOUS
  Filled 2017-10-20: qty 1
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
## 2017-10-23 ENCOUNTER — Ambulatory Visit
Admission: RE | Admit: 2017-10-23 | Discharge: 2017-10-23 | Disposition: A | Discharge status: Home / Self Care | Payer: BC Managed Care – PPO | Source: Ambulatory Visit | Attending: Orthopedic Surgery | Admitting: Orthopedic Surgery

## 2017-10-23 ENCOUNTER — Encounter
Admission: RE | Disposition: A | Discharge status: Home / Self Care | Payer: Self-pay | Source: Ambulatory Visit | Attending: Orthopedic Surgery

## 2017-10-23 ENCOUNTER — Ambulatory Visit: Payer: BC Managed Care – PPO | Admitting: Anesthesiology

## 2017-10-23 ENCOUNTER — Encounter: Payer: Self-pay | Admitting: *Deleted

## 2017-10-23 DIAGNOSIS — E669 Obesity, unspecified: Secondary | ICD-10-CM | POA: Insufficient documentation

## 2017-10-23 DIAGNOSIS — E1151 Type 2 diabetes mellitus with diabetic peripheral angiopathy without gangrene: Secondary | ICD-10-CM | POA: Diagnosis not present

## 2017-10-23 DIAGNOSIS — F329 Major depressive disorder, single episode, unspecified: Secondary | ICD-10-CM | POA: Diagnosis not present

## 2017-10-23 DIAGNOSIS — Z791 Long term (current) use of non-steroidal anti-inflammatories (NSAID): Secondary | ICD-10-CM | POA: Diagnosis not present

## 2017-10-23 DIAGNOSIS — G4733 Obstructive sleep apnea (adult) (pediatric): Secondary | ICD-10-CM | POA: Diagnosis not present

## 2017-10-23 DIAGNOSIS — Y92410 Unspecified street and highway as the place of occurrence of the external cause: Secondary | ICD-10-CM | POA: Diagnosis not present

## 2017-10-23 DIAGNOSIS — Z6839 Body mass index (BMI) 39.0-39.9, adult: Secondary | ICD-10-CM | POA: Insufficient documentation

## 2017-10-23 DIAGNOSIS — E039 Hypothyroidism, unspecified: Secondary | ICD-10-CM | POA: Insufficient documentation

## 2017-10-23 DIAGNOSIS — I10 Essential (primary) hypertension: Secondary | ICD-10-CM | POA: Insufficient documentation

## 2017-10-23 DIAGNOSIS — S62317A Displaced fracture of base of fifth metacarpal bone. left hand, initial encounter for closed fracture: Secondary | ICD-10-CM | POA: Insufficient documentation

## 2017-10-23 DIAGNOSIS — Z79899 Other long term (current) drug therapy: Secondary | ICD-10-CM | POA: Diagnosis not present

## 2017-10-23 DIAGNOSIS — S62317D Displaced fracture of base of fifth metacarpal bone. left hand, subsequent encounter for fracture with routine healing: Secondary | ICD-10-CM

## 2017-10-23 DIAGNOSIS — Z7982 Long term (current) use of aspirin: Secondary | ICD-10-CM | POA: Insufficient documentation

## 2017-10-23 HISTORY — PX: CLOSED REDUCTION FINGER WITH PERCUTANEOUS PINNING: SHX5612

## 2017-10-23 LAB — GLUCOSE, CAPILLARY
Glucose-Capillary: 168 mg/dL — ABNORMAL HIGH (ref 65–99)
Glucose-Capillary: 133 mg/dL — ABNORMAL HIGH (ref 65–99)

## 2017-10-23 SURGERY — CLOSED REDUCTION, FINGER, WITH PERCUTANEOUS PINNING
Anesthesia: General | Laterality: Left

## 2017-10-23 MED ORDER — OXYCODONE HCL 5 MG PO TABS: 5.0000 mg | ORAL_TABLET | ORAL | 0 refills | Status: DC | PRN

## 2017-10-23 MED ORDER — ONDANSETRON HCL 4 MG PO TABS: 4.0000 mg | ORAL_TABLET | Freq: Four times a day (QID) | ORAL | Status: DC | PRN

## 2017-10-23 MED ORDER — SODIUM CHLORIDE 0.9 % IV SOLN
INTRAVENOUS | Status: DC | PRN
  Administered 2017-10-23: 11:00:00 via INTRAVENOUS

## 2017-10-23 MED ORDER — OXYCODONE-ACETAMINOPHEN 5-325 MG PO TABS: 1.0000 | ORAL_TABLET | ORAL | Status: DC | PRN

## 2017-10-23 MED ORDER — DEXAMETHASONE SODIUM PHOSPHATE 10 MG/ML IJ SOLN
INTRAMUSCULAR | Status: DC | PRN
  Administered 2017-10-23: 5 mg via INTRAVENOUS

## 2017-10-23 MED ORDER — SODIUM CHLORIDE 0.9 % IV SOLN
Freq: Once | INTRAVENOUS | Status: AC
  Administered 2017-10-23: 10:00:00 via INTRAVENOUS

## 2017-10-23 MED ORDER — METOCLOPRAMIDE HCL 5 MG/ML IJ SOLN: 5.0000 mg | Freq: Three times a day (TID) | INTRAMUSCULAR | Status: DC | PRN

## 2017-10-23 MED ORDER — ONDANSETRON HCL 4 MG/2ML IJ SOLN
INTRAMUSCULAR | Status: DC | PRN
  Administered 2017-10-23: 4 mg via INTRAVENOUS

## 2017-10-23 MED ORDER — LIDOCAINE HCL (CARDIAC) 20 MG/ML IV SOLN
INTRAVENOUS | Status: DC | PRN
  Administered 2017-10-23: 100 mg via INTRAVENOUS

## 2017-10-23 MED ORDER — ONDANSETRON HCL 4 MG/2ML IJ SOLN: 4.0000 mg | Freq: Once | INTRAMUSCULAR | Status: DC | PRN

## 2017-10-23 MED ORDER — METOCLOPRAMIDE HCL 10 MG PO TABS: 5.0000 mg | ORAL_TABLET | Freq: Three times a day (TID) | ORAL | Status: DC | PRN

## 2017-10-23 MED ORDER — OXYCODONE HCL 5 MG PO TABS: 5.0000 mg | ORAL_TABLET | ORAL | Status: DC | PRN

## 2017-10-23 MED ORDER — ONDANSETRON HCL 4 MG/2ML IJ SOLN: 4.0000 mg | Freq: Four times a day (QID) | INTRAMUSCULAR | Status: DC | PRN

## 2017-10-23 MED ORDER — PROPOFOL 10 MG/ML IV BOLUS
INTRAVENOUS | Status: DC | PRN
  Administered 2017-10-23: 200 mg via INTRAVENOUS

## 2017-10-23 MED ORDER — FENTANYL CITRATE (PF) 100 MCG/2ML IJ SOLN
INTRAMUSCULAR | Status: AC
  Filled 2017-10-23: qty 2

## 2017-10-23 MED ORDER — BUPIVACAINE HCL (PF) 0.5 % IJ SOLN
INTRAMUSCULAR | Status: DC | PRN
  Administered 2017-10-23: 10 mL

## 2017-10-23 MED ORDER — MIDAZOLAM HCL 2 MG/2ML IJ SOLN
INTRAMUSCULAR | Status: AC
  Filled 2017-10-23: qty 2

## 2017-10-23 MED ORDER — FENTANYL CITRATE (PF) 100 MCG/2ML IJ SOLN: 25.0000 ug | INTRAMUSCULAR | Status: DC | PRN

## 2017-10-23 MED ORDER — SODIUM CHLORIDE 0.9 % IV SOLN: INTRAVENOUS | Status: DC

## 2017-10-23 MED ORDER — FENTANYL CITRATE (PF) 100 MCG/2ML IJ SOLN
INTRAMUSCULAR | Status: DC | PRN
  Administered 2017-10-23 (×2): 25 ug via INTRAVENOUS
  Administered 2017-10-23: 50 ug via INTRAVENOUS

## 2017-10-23 MED ORDER — PHENYLEPHRINE HCL 10 MG/ML IJ SOLN
INTRAMUSCULAR | Status: DC | PRN
Start: 2017-10-23 — End: 2017-10-23
  Administered 2017-10-23: 100 ug via INTRAVENOUS

## 2017-10-23 MED ORDER — PROPOFOL 10 MG/ML IV BOLUS
INTRAVENOUS | Status: AC
  Filled 2017-10-23: qty 20

## 2017-10-23 MED ORDER — BUPIVACAINE HCL (PF) 0.5 % IJ SOLN
INTRAMUSCULAR | Status: AC
  Filled 2017-10-23: qty 30

## 2017-10-23 SURGICAL SUPPLY — 13 items
CHLORAPREP W/TINT 26ML (MISCELLANEOUS) ×2 IMPLANT
DRAPE FLUOR MINI C-ARM 54X84 (DRAPES) ×2 IMPLANT
GAUZE PETRO XEROFOAM 1X8 (MISCELLANEOUS) ×2 IMPLANT
GAUZE SPONGE 4X4 12PLY STRL (GAUZE/BANDAGES/DRESSINGS) ×2 IMPLANT
GLOVE SURG SYN 9.0  PF PI (GLOVE) ×1
GLOVE SURG SYN 9.0 PF PI (GLOVE) ×1 IMPLANT
GOWN SRG 2XL LVL 4 RGLN SLV (GOWNS) ×1 IMPLANT
GOWN STRL NON-REIN 2XL LVL4 (GOWNS) ×1
GOWN STRL REUS W/ TWL LRG LVL3 (GOWN DISPOSABLE) ×1 IMPLANT
GOWN STRL REUS W/TWL LRG LVL3 (GOWN DISPOSABLE) ×1
KIT RM TURNOVER STRD PROC AR (KITS) ×2 IMPLANT
PACK EXTREMITY ARMC (MISCELLANEOUS) ×2 IMPLANT
PAD PREP 24X41 OB/GYN DISP (PERSONAL CARE ITEMS) ×2 IMPLANT

## 2017-10-23 NOTE — Anesthesia Post-op Follow-up Note (Signed)
Anesthesia QCDR form completed.        

## 2017-10-23 NOTE — Op Note (Signed)
10/23/2017  12:07 PM  PATIENT:  Nicholas Cabrera  51 y.o. male  PRE-OPERATIVE DIAGNOSIS:  closed displaced fracture of fifth metacarpal bone left hand  POST-OPERATIVE DIAGNOSIS:  closed displaced fracture of fifth metacarpal bone left hand  PROCEDURE:  Procedure(s): CLOSED REDUCTION FINGER WITH PERCUTANEOUS PINNING-LEFT FIFTH (Left)  SURGEON: Laurene Footman, MD  ASSISTANTS: None  ANESTHESIA:   general  EBL:  Total I/O In: 700 [I.V.:700] Out: -   BLOOD ADMINISTERED:none  DRAINS: none   LOCAL MEDICATIONS USED:  MARCAINE     SPECIMEN:  No Specimen  DISPOSITION OF SPECIMEN:  N/A  COUNTS:  YES  TOURNIQUET:  * No tourniquets in log *  IMPLANTS: K wire 2  DICTATION: .Dragon Dictation patient was brought to the operating room and after adequate anesthesia was obtained left arm was prepped and draped in sterile fashion. After appropriate patient identification and timeout procedure the hand was examined under the mini C-arm. With digital pressure to the base of the fifth metacarpal the fracture reduced well. K wires then inserted percutaneously through the more proximal fragment into the fourth base of the fourth metacarpal and a second wire was placed obliquely from midshaft of the fifth metacarpal into the base of the fourth and this gave essentially anatomic alignment. The K wires then cut short and bent over wrapped with Xeroform and the wound dressed after infiltration of 10 cc of half percent Sensorcaine into the area of the incisions obtained postop analgesia. 4 x 4's web roll and a volar splint were applied patient to our procedure well  PLAN OF CARE: Discharge to home after PACU  PATIENT DISPOSITION:  PACU - hemodynamically stable.

## 2017-10-23 NOTE — Anesthesia Preprocedure Evaluation (Addendum)
Anesthesia Evaluation  Patient identified by MRN, date of birth, ID band Patient awake    Reviewed: Allergy & Precautions, NPO status , Patient's Chart, lab work & pertinent test results, reviewed documented beta blocker date and time   Airway Mallampati: III  TM Distance: >3 FB     Dental  (+) Caps, Chipped   Pulmonary asthma , sleep apnea and Continuous Positive Airway Pressure Ventilation , former smoker,    Pulmonary exam normal        Cardiovascular hypertension, Pt. on medications and Pt. on home beta blockers + Peripheral Vascular Disease  Normal cardiovascular exam     Neuro/Psych PSYCHIATRIC DISORDERS Depression    GI/Hepatic negative GI ROS, Neg liver ROS,   Endo/Other  diabetes, Well Controlled, Type 2Hypothyroidism   Renal/GU negative Renal ROS  negative genitourinary   Musculoskeletal   Abdominal (+) + obese,   Peds negative pediatric ROS (+)  Hematology negative hematology ROS (+)   Anesthesia Other Findings Past Medical History: No date: Asthma No date: Depression No date: Diabetes ([place]) No date: Hx of diverticulitis of colon No date: Hypertension No date: Hypothyroidism No date: Hypothyroidism No date: Low testosterone No date: Obesity No date: Obesity No date: OSA (obstructive sleep apnea) No date: Testicular hypofunction No date: Vitamin D deficiency  Reproductive/Obstetrics                             Anesthesia Physical Anesthesia Plan  ASA: III  Anesthesia Plan: General   Post-op Pain Management:    Induction: Intravenous  PONV Risk Score and Plan:   Airway Management Planned: LMA  Additional Equipment:   Intra-op Plan:   Post-operative Plan: Extubation in OR  Informed Consent: I have reviewed the patients History and Physical, chart, labs and discussed the procedure including the risks, benefits and alternatives for the proposed anesthesia with  the patient or authorized representative who has indicated his/her understanding and acceptance.   Dental advisory given  Plan Discussed with: CRNA and Surgeon  Anesthesia Plan Comments:         Anesthesia Quick Evaluation

## 2017-10-23 NOTE — Discharge Instructions (Addendum)
AMBULATORY SURGERY  DISCHARGE INSTRUCTIONS   1) The drugs that you were given will stay in your system until tomorrow so for the next 24 hours you should not:  A) Drive an automobile B) Make any legal decisions C) Drink any alcoholic beverage   2) You may resume regular meals tomorrow.  Today it is better to start with liquids and gradually work up to solid foods.  You may eat anything you prefer, but it is better to start with liquids, then soup and crackers, and gradually work up to solid foods.   3) Please notify your doctor immediately if you have any unusual bleeding, trouble breathing, redness and pain at the surgery site, drainage, fever, or pain not relieved by medication. 4)   5) Your post-operative visit with Dr.                                     is: Date:                        Time:    Please call to schedule your post-operative visit.  6) Additional Instructions:      Keep dressing clean and dry Keep arm elevated Work on finger range of motion

## 2017-10-23 NOTE — Anesthesia Procedure Notes (Signed)
Procedure Name: LMA Insertion Date/Time: 10/23/2017 11:29 AM Performed by: Dionne Bucy Pre-anesthesia Checklist: Patient identified, Patient being monitored, Timeout performed, Emergency Drugs available and Suction available Patient Re-evaluated:Patient Re-evaluated prior to induction Oxygen Delivery Method: Circle system utilized Preoxygenation: Pre-oxygenation with 100% oxygen Induction Type: IV induction Ventilation: Mask ventilation without difficulty LMA: LMA inserted LMA Size: 4.5 Tube type: Oral Number of attempts: 1 Placement Confirmation: positive ETCO2 and breath sounds checked- equal and bilateral Tube secured with: Tape Dental Injury: Teeth and Oropharynx as per pre-operative assessment

## 2017-10-23 NOTE — Transfer of Care (Signed)
Immediate Anesthesia Transfer of Care Note  Patient: Nicholas Cabrera  Procedure(s) Performed: CLOSED REDUCTION FINGER WITH PERCUTANEOUS PINNING-LEFT FIFTH (Left )  Patient Location: PACU  Anesthesia Type:General  Level of Consciousness: sedated  Airway & Oxygen Therapy: Patient Spontanous Breathing and Patient connected to face mask oxygen  Post-op Assessment: Report given to RN and Post -op Vital signs reviewed and stable  Post vital signs: Reviewed and stable  Last Vitals:  Vitals:   10/23/17 0928  BP: (!) 125/93  Pulse: 74  Resp: 20  Temp: (!) 36 C  SpO2: 98%    Last Pain:  Vitals:   10/23/17 0928  TempSrc: Tympanic         Complications: No apparent anesthesia complications

## 2017-10-23 NOTE — H&P (Signed)
Reviewed paper H+P, will be scanned into chart. No changes noted.  

## 2017-10-24 NOTE — Anesthesia Postprocedure Evaluation (Signed)
Anesthesia Post Note  Patient: Nicholas Cabrera  Procedure(s) Performed: CLOSED REDUCTION FINGER WITH PERCUTANEOUS PINNING-LEFT FIFTH (Left )  Patient location during evaluation: PACU Anesthesia Type: General Level of consciousness: awake and alert and oriented Pain management: pain level controlled Vital Signs Assessment: post-procedure vital signs reviewed and stable Respiratory status: spontaneous breathing Cardiovascular status: blood pressure returned to baseline Anesthetic complications: no     Last Vitals:  Vitals:   10/23/17 1256 10/23/17 1330  BP: 126/87 116/76  Pulse: 73 70  Resp: 12   Temp: (!) 36.3 C   SpO2: 95% 95%    Last Pain:  Vitals:   10/23/17 1256  TempSrc:   PainSc: 0-No pain                 Kaily Wragg

## 2018-02-10 ENCOUNTER — Other Ambulatory Visit: Payer: Self-pay

## 2018-02-10 ENCOUNTER — Ambulatory Visit: Payer: BC Managed Care – PPO | Attending: Orthopedic Surgery | Admitting: Occupational Therapy

## 2018-02-10 ENCOUNTER — Encounter: Payer: Self-pay | Admitting: Occupational Therapy

## 2018-02-10 DIAGNOSIS — M25632 Stiffness of left wrist, not elsewhere classified: Secondary | ICD-10-CM | POA: Diagnosis present

## 2018-02-10 DIAGNOSIS — M25642 Stiffness of left hand, not elsewhere classified: Secondary | ICD-10-CM | POA: Insufficient documentation

## 2018-02-10 DIAGNOSIS — R200 Anesthesia of skin: Secondary | ICD-10-CM | POA: Diagnosis present

## 2018-02-10 DIAGNOSIS — R202 Paresthesia of skin: Secondary | ICD-10-CM | POA: Diagnosis present

## 2018-02-10 DIAGNOSIS — M79642 Pain in left hand: Secondary | ICD-10-CM | POA: Insufficient documentation

## 2018-02-10 DIAGNOSIS — M25532 Pain in left wrist: Secondary | ICD-10-CM | POA: Insufficient documentation

## 2018-02-10 DIAGNOSIS — M6281 Muscle weakness (generalized): Secondary | ICD-10-CM | POA: Insufficient documentation
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
## 2018-02-10 NOTE — Patient Instructions (Signed)
Contrast in water with gentle AROM tendon glides  Opposition  Picking up 1cm objects  Thumb PA and RA AROM   AROM for wrist flexion , ext, and AAROM on table for RD, UD  10 reps  All 2-3 x day  Gentle ulnar N glide - 5 reps  But not full  Scar massage over pins  And cica scar pad for night time use and daytime under pisiforme during typing at work - and get gel pad to rest base of palm on during typing

## 2018-02-16 ENCOUNTER — Ambulatory Visit: Payer: BC Managed Care – PPO | Admitting: Occupational Therapy

## 2018-02-16 DIAGNOSIS — R200 Anesthesia of skin: Secondary | ICD-10-CM

## 2018-02-16 DIAGNOSIS — R202 Paresthesia of skin: Secondary | ICD-10-CM

## 2018-02-16 DIAGNOSIS — M25532 Pain in left wrist: Secondary | ICD-10-CM

## 2018-02-16 DIAGNOSIS — M79642 Pain in left hand: Secondary | ICD-10-CM | POA: Diagnosis not present

## 2018-02-16 DIAGNOSIS — M6281 Muscle weakness (generalized): Secondary | ICD-10-CM

## 2018-02-16 DIAGNOSIS — M25632 Stiffness of left wrist, not elsewhere classified: Secondary | ICD-10-CM

## 2018-02-16 DIAGNOSIS — M25642 Stiffness of left hand, not elsewhere classified: Secondary | ICD-10-CM

## 2018-02-16 NOTE — Therapy (Signed)
[facility] [address], [place], [address] Phone: [phone]   Fax:  [phone]  Occupational Therapy Treatment  Patient Details  Name: Nicholas Cabrera MRN: [id number] Date of Birth: 27-Aug-1966 Referring Provider: Tamala Ser   Encounter Date: 02/16/2018  OT End of Session - 02/16/18 1837    Visit Number  2    Number of Visits  8    Date for OT Re-Evaluation  03/10/18    OT Start Time  1218    OT Stop Time  1314    OT Time Calculation (min)  56 min    Activity Tolerance  Patient tolerated treatment well    Behavior During Therapy  [facility] for tasks assessed/performed       Past Medical History:  Diagnosis Date  . Asthma   . Depression   . Diabetes ([place])   . Hx of diverticulitis of colon   . Hypertension   . Hypothyroidism   . Hypothyroidism   . Low testosterone   . Obesity   . Obesity   . OSA (obstructive sleep apnea)   . Testicular hypofunction   . Vitamin D deficiency     Past Surgical History:  Procedure Laterality Date  . CLOSED REDUCTION FINGER WITH PERCUTANEOUS PINNING Left 10/23/2017   Procedure: CLOSED REDUCTION FINGER WITH PERCUTANEOUS PINNING-LEFT FIFTH;  Surgeon: Hessie Knows, MD;  Location: [facility] ORS;  Service: Orthopedics;  Laterality: Left;  . COLONOSCOPY    . POLYPECTOMY      There were no vitals filed for this visit.  Subjective Assessment - 02/16/18 1827    Subjective   Oh you need to see - you'll be so proud - I did what you told me and pain is better and range of motion - still pain and tenderness but not close to what it was - still tender and knot in my palm where ring finger tendon are - and side of hand still numb     Patient Stated Goals  I want to be able to use my hand again so I can do my work without issues, play guitar, use my hand in activities at home     Currently in Pain?  Yes    Pain Score  3     Pain Location  Hand    Pain Orientation  Left    Pain  Descriptors / Indicators  Aching;Tender;Numbness    Pain Type  Surgical pain    Pain Onset  More than a month ago         [facility] OT Assessment - 02/16/18 0001      AROM   Left Wrist Extension  66 Degrees    Left Wrist Flexion  55 Degrees    Left Wrist Radial Deviation  13 Degrees    Left Wrist Ulnar Deviation  33 Degrees      Strength   Right Hand Grip (lbs)  62    Right Hand Lateral Pinch  12 lbs    Right Hand 3 Point Pinch  11 lbs    Left Hand Grip (lbs)  22    Left Hand Lateral Pinch  6 lbs    Left Hand 3 Point Pinch  6 lbs      Left Hand AROM   L Index  MCP 0-90  90 Degrees    L Index PIP 0-100  95 Degrees    L Long  MCP 0-90  90 Degrees  L Long PIP 0-100  95 Degrees    L Ring  MCP 0-90  85 Degrees    L Ring PIP 0-100  100 Degrees    L Little  MCP 0-90  82 Degrees    L Little PIP 0-100  95 Degrees      Measurements taken for AROM for digiits and wrist - see flowsheet   great progress -  Grip and prehenison about the same and pain full still   pt still tender over palm on ulnar side of hand and numb on ulnar side of hand  Upon Hali Marry - pt had some decrease sensation up into ulnar forearm -  Cubital tunnel very tender and Positive Tinel   fabricated pt elbow sleeve with padding - wear during day over tunnel and night time inside of elbow          OT Treatments/Exercises (OP) - 02/16/18 0001      Cryotherapy   Number Minutes Cryotherapy  3 Minutes    Cryotherapy Location  Hand;Wrist    Type of Cryotherapy  Ice pack end of session       LUE Fluidotherapy   Number Minutes Fluidotherapy  10 Minutes    LUE Fluidotherapy Location  Hand;Wrist    Comments  prior to manual to decrease pain and increase ROM       Soft tissue massage to webspace   ulnar side of hand , carpal spreads, gentle traction and joint mobs to 4th and 5th MC's   Graston tool nr 2 over palm , volar digits and volar forearm -sweeping  Tightness and tenderness ulnar side and  volar palm - on 4th and 5th MC's  Scar massage  Review with pt tendon glides  And opposition  As well as Ulnar N glide        OT Education - 02/16/18 1837    Education Details  progress discuss , and focus cont on edema ,pain and AROM -and cubital tunnel symptoms and HEP for that     Person(s) Educated  Patient    Methods  Explanation;Demonstration;Tactile cues;Verbal cues    Comprehension  Returned demonstration;Verbalized understanding       OT Short Term Goals - 02/10/18 1749      OT SHORT TERM GOAL #1   Title  Pain on PRWHE improve with more than 20 points     Baseline  at eval PRWHE pain score are 33/50     Time  2    Period  Weeks    Status  New    Target Date  02/24/18      OT SHORT TERM GOAL #2   Title  L wrist AROM improve to [facility] without pain report increase use     Baseline  wrist decrease in all planes - see flowsheet - and pain     Time  3    Period  Weeks    Status  New    Target Date  03/03/18      OT SHORT TERM GOAL #3   Title  Pt to be independent in HEP to decrease pain , numbness - increase AROM to use with pain less than 4/10     Baseline  pain increase to 8/10 - numbness constrant - deep pressure on hypothenar eminence     Time  3    Period  Weeks    Status  New    Target Date  03/03/18  OT Long Term Goals - 02/10/18 1752      OT LONG TERM GOAL #1   Title  L hand digits AROM improve for pt to make composite fist with no pain     Baseline  L hand AROM in all digits and all joints decrease - see flowsheet     Time  4    Period  Weeks    Status  New    Target Date  03/10/18      OT LONG TERM GOAL #2   Title  L grip strength increase to more than 50% compare to R hand to increase function score more than 15 points     Baseline  Grip L hand 18 lbs. L 62 lbs - Function score on PRWHE 34.5/50     Time  4    Period  Weeks    Status  New    Target Date  03/10/18            Plan - 02/16/18 1838    Clinical Impression Statement   Pt made great progress in AROM for L hand digits and wrist - decrease pain to 3-5/10 - from 4-8/10 last week - still cont to have pain and tenderness over palm , 4thand 5th digits and wrist - as well as tightness  in soft tissue - cont to have numbness in ular hand - but upon further exam this date - pt had some decrease sensation up into forearm - pt very tender over cubital tunnel and positie Tinel  in forearm - but pt also very tight in cervical and upper traps -  pt was ed on modifications to do for cubital tunnel - and to wear elbow pad      Occupational performance deficits (Please refer to evaluation for details):  IADL's;Work;Play;Leisure    Rehab Potential  Fair    Current Impairments/barriers affecting progress:   Pt had numbness from start - pt report no improvement - and went back to work 7 wks - 12 hrs shifts     OT Frequency  2x / week    OT Duration  4 weeks    OT Treatment/Interventions  Splinting;Fluidtherapy;Self-care/ADL training;Contrast Bath;Ultrasound;Therapeutic exercise;Scar mobilization;Passive range of motion;Paraffin;Manual Therapy    Plan  assess progress from HEP including modifcations to cubital tunnel - and ROM and pain     Clinical Decision Making  Several treatment options, min-mod task modification necessary    OT Home Exercise Plan  see pt instruction     Consulted and Agree with Plan of Care  Patient       Patient will benefit from skilled therapeutic intervention in order to improve the following deficits and impairments:  Pain, Impaired flexibility, Decreased coordination, Decreased scar mobility, Impaired sensation, Decreased strength, Decreased range of motion, Impaired UE functional use  Visit Diagnosis: Pain in left hand  Pain in left wrist  Numbness and tingling in left hand  Stiffness of left hand, not elsewhere classified  Stiffness of left wrist, not elsewhere classified  Muscle weakness (generalized)    Problem List Patient Active Problem  List   Diagnosis Date Noted  . Hypomagnesemia 05/21/2016  . Fatigue 05/21/2016  . Hypogonadism in male 05/21/2016  . Vitamin D deficiency 05/21/2016  . Erectile dysfunction 05/21/2016  . Poor circulation of extremity 04/24/2016  . Diverticulitis 08/12/2015  . Obstructive sleep apnea 07/01/2015  . Type 2 diabetes mellitus, uncontrolled ([place])   . Hypertension   . Hyperlipidemia   . Hypothyroidism   .  Obesity   . Depression     Rosalyn Gess OTR/L,CLT 02/16/2018, 6:43 PM  [facility] [address] [place], [address] Phone: [phone]   Fax:  [phone]  Name: JAMESPAUL SECRIST MRN: [id number] Date of Birth: 07/29/1966

## 2018-02-16 NOTE — Patient Instructions (Signed)
Cont with same HEP - focus on decrease edema , pain and increase AROM  Elbow pad fabricate for cubital tunnel to wear when sitting  And on inside of elbow when sleeping   ed on act and positions to avoid to aggravate Cubital tunnel

## 2018-02-20 ENCOUNTER — Ambulatory Visit: Payer: BC Managed Care – PPO | Admitting: Occupational Therapy

## 2018-02-24 ENCOUNTER — Ambulatory Visit: Payer: BC Managed Care – PPO | Admitting: Occupational Therapy

## 2018-02-24 DIAGNOSIS — R200 Anesthesia of skin: Secondary | ICD-10-CM

## 2018-02-24 DIAGNOSIS — R202 Paresthesia of skin: Secondary | ICD-10-CM

## 2018-02-24 DIAGNOSIS — M25532 Pain in left wrist: Secondary | ICD-10-CM

## 2018-02-24 DIAGNOSIS — M25632 Stiffness of left wrist, not elsewhere classified: Secondary | ICD-10-CM

## 2018-02-24 DIAGNOSIS — M79642 Pain in left hand: Secondary | ICD-10-CM | POA: Diagnosis not present

## 2018-02-24 DIAGNOSIS — M6281 Muscle weakness (generalized): Secondary | ICD-10-CM

## 2018-02-24 DIAGNOSIS — M25642 Stiffness of left hand, not elsewhere classified: Secondary | ICD-10-CM

## 2018-02-24 NOTE — Therapy (Signed)
[place] [facility] [address], [place], [address] Phone: [phone]   Fax:  [phone]  Occupational Therapy Treatment  Patient Details  Name: Nicholas Cabrera MRN: [id number] Date of Birth: 1966/01/13 Referring Provider: Tamala Ser   Encounter Date: 02/24/2018  OT End of Session - 02/24/18 1447    Visit Number  3    Number of Visits  8    Date for OT Re-Evaluation  03/10/18    OT Start Time  1345    OT Stop Time  1436    OT Time Calculation (min)  51 min    Activity Tolerance  Patient tolerated treatment well    Behavior During Therapy  [facility] for tasks assessed/performed       Past Medical History:  Diagnosis Date  . Asthma   . Depression   . Diabetes ([place])   . Hx of diverticulitis of colon   . Hypertension   . Hypothyroidism   . Hypothyroidism   . Low testosterone   . Obesity   . Obesity   . OSA (obstructive sleep apnea)   . Testicular hypofunction   . Vitamin D deficiency     Past Surgical History:  Procedure Laterality Date  . CLOSED REDUCTION FINGER WITH PERCUTANEOUS PINNING Left 10/23/2017   Procedure: CLOSED REDUCTION FINGER WITH PERCUTANEOUS PINNING-LEFT FIFTH;  Surgeon: Hessie Knows, MD;  Location: [facility] ORS;  Service: Orthopedics;  Laterality: Left;  . COLONOSCOPY    . POLYPECTOMY      There were no vitals filed for this visit.  Subjective Assessment - 02/24/18 1445    Subjective   Doing better - can make fist , open hand all the way - pain not at rest and at the worse the last week about 3/10 - tenderness less- but numbness still on side of hand- I do have nerve conduction test tomorrow     Patient Stated Goals  I want to be able to use my hand again so I can do my work without issues, play guitar, use my hand in activities at home     Currently in Pain?  No/denies         [facility] OT Assessment - 02/24/18 0001      AROM   Left Wrist Extension  64 Degrees    Left Wrist Flexion   75 Degrees    Left Wrist Radial Deviation  17 Degrees    Left Wrist Ulnar Deviation  35 Degrees      Strength   Left Hand Grip (lbs)  25    Left Hand Lateral Pinch  12 lbs    Left Hand 3 Point Pinch  8 lbs      Left Hand AROM   L Little  MCP 0-90  85 Degrees        Measurements taken for AROM for digiits and wrist - see flowsheet    Grip and prehenison about the same except lat grip   pt still tender over palm on ulnar side of hand and numb on ulnar side of hand   Cubital tunnel very tender and but decrease in  Tinel  Apply new padding to  elbow sleeve with padding - wear during day over tunnel and night time inside of elbow   Soft tissue massage to webspace   ulnar side of hand , carpal spreads, gentle traction and joint mobs to 4th and 5th MC's   Graston tool nr 2 over palm ,  volar digits and volar forearm -sweeping  Tightness and tenderness ulnar side and volar palm - on 4th and 5th MC's  But not as much as lat time   Scar massage  Review with pt tendon glides  And wrist prayer stretch - cont at home  Cont with opposition  As well as Ulnar N glide   Add this date light blue putty for gripping , rolling with 3 point grip  And pulling with all digits   should be pain free and stop prior to feeling pull  10 reps   2 x day   an increase in 3 days if not increase pain -can do 2 sets           OT Treatments/Exercises (OP) - 02/24/18 0001      LUE Fluidotherapy   Number Minutes Fluidotherapy  10 Minutes    LUE Fluidotherapy Location  Hand;Wrist    Comments  at [facility] prior to soft itssue and  grip strenghtening              OT Education - 02/24/18 1446    Education provided  Yes    Education Details  progress, putty review and add to HEP     Person(s) Educated  Patient    Methods  Explanation;Demonstration;Tactile cues;Handout    Comprehension  Verbalized understanding;Returned demonstration       OT Short Term Goals - 02/10/18 1749      OT SHORT  TERM GOAL #1   Title  Pain on PRWHE improve with more than 20 points     Baseline  at eval PRWHE pain score are 33/50     Time  2    Period  Weeks    Status  New    Target Date  02/24/18      OT SHORT TERM GOAL #2   Title  L wrist AROM improve to [facility] without pain report increase use     Baseline  wrist decrease in all planes - see flowsheet - and pain     Time  3    Period  Weeks    Status  New    Target Date  03/03/18      OT SHORT TERM GOAL #3   Title  Pt to be independent in HEP to decrease pain , numbness - increase AROM to use with pain less than 4/10     Baseline  pain increase to 8/10 - numbness constrant - deep pressure on hypothenar eminence     Time  3    Period  Weeks    Status  New    Target Date  03/03/18        OT Long Term Goals - 02/10/18 1752      OT LONG TERM GOAL #1   Title  L hand digits AROM improve for pt to make composite fist with no pain     Baseline  L hand AROM in all digits and all joints decrease - see flowsheet     Time  4    Period  Weeks    Status  New    Target Date  03/10/18      OT LONG TERM GOAL #2   Title  L grip strength increase to more than 50% compare to R hand to increase function score more than 15 points     Baseline  Grip L hand 18 lbs. L 62 lbs - Function score on PRWHE 34.5/50  Time  4    Period  Weeks    Status  New    Target Date  03/10/18            Plan - 02/24/18 1450    Clinical Impression Statement  Pt made great progress in 3 visits in digits AROM , wrist AROM - and pain - cont to have numbness on ulnar side of hand -pt schedule for nerve conduction test tomorrow-  still tender over A1pulley of 3rd digit , and pain reported this past week 3/10 at the worse - do have some strain with gripping or wrist ROM at volar wrist and base of 5th - did initiated this date some grip strenghtening for HEP -but pt to no over do but follow HEP     Occupational performance deficits (Please refer to evaluation for details):   IADL's;Work;Play;Leisure    Rehab Potential  Fair    Current Impairments/barriers affecting progress:   Pt had numbness from start - pt report no improvement - and went back to work 7 wks - 12 hrs shifts     OT Frequency  1x / week    OT Duration  4 weeks    OT Treatment/Interventions  Splinting;Fluidtherapy;Self-care/ADL training;Contrast Bath;Ultrasound;Therapeutic exercise;Scar mobilization;Passive range of motion;Paraffin;Manual Therapy    Plan  results from nerve conduction -and assess grip and progress with putty     Clinical Decision Making  Several treatment options, min-mod task modification necessary    OT Home Exercise Plan  see pt instruction     Consulted and Agree with Plan of Care  Patient       Patient will benefit from skilled therapeutic intervention in order to improve the following deficits and impairments:  Pain, Impaired flexibility, Decreased coordination, Decreased scar mobility, Impaired sensation, Decreased strength, Decreased range of motion, Impaired UE functional use  Visit Diagnosis: Pain in left hand  Pain in left wrist  Numbness and tingling in left hand  Stiffness of left hand, not elsewhere classified  Muscle weakness (generalized)  Stiffness of left wrist, not elsewhere classified    Problem List Patient Active Problem List   Diagnosis Date Noted  . Hypomagnesemia 05/21/2016  . Fatigue 05/21/2016  . Hypogonadism in male 05/21/2016  . Vitamin D deficiency 05/21/2016  . Erectile dysfunction 05/21/2016  . Poor circulation of extremity 04/24/2016  . Diverticulitis 08/12/2015  . Obstructive sleep apnea 07/01/2015  . Type 2 diabetes mellitus, uncontrolled ([place])   . Hypertension   . Hyperlipidemia   . Hypothyroidism   . Obesity   . Depression     Derya Dettmann, Gwenette Greet OTR/L,CLT 02/24/2018, 2:55 PM  [place] [facility] [address] [place], [address] Phone: [phone]   Fax:   [phone]  Name: Nicholas Cabrera MRN: [id number] Date of Birth: [birth year]

## 2018-03-05 ENCOUNTER — Ambulatory Visit: Payer: BC Managed Care – PPO | Attending: Orthopedic Surgery | Admitting: Occupational Therapy

## 2018-03-05 DIAGNOSIS — R200 Anesthesia of skin: Secondary | ICD-10-CM | POA: Diagnosis present

## 2018-03-05 DIAGNOSIS — M25632 Stiffness of left wrist, not elsewhere classified: Secondary | ICD-10-CM | POA: Diagnosis present

## 2018-03-05 DIAGNOSIS — M6281 Muscle weakness (generalized): Secondary | ICD-10-CM | POA: Diagnosis present

## 2018-03-05 DIAGNOSIS — M79642 Pain in left hand: Secondary | ICD-10-CM | POA: Diagnosis present

## 2018-03-05 DIAGNOSIS — M25642 Stiffness of left hand, not elsewhere classified: Secondary | ICD-10-CM | POA: Insufficient documentation

## 2018-03-05 DIAGNOSIS — R202 Paresthesia of skin: Secondary | ICD-10-CM | POA: Diagnosis present

## 2018-03-05 DIAGNOSIS — M25532 Pain in left wrist: Secondary | ICD-10-CM | POA: Insufficient documentation

## 2018-03-05 NOTE — Patient Instructions (Signed)
Add PROM and AAROM to wrist flexion , ext,  1 lbs weight or 16 oz hammer for sup /pro, RD, UD and wrist flexion , ext  10 reps each   2 x day  Cont with puttty upgrade to teal  And cont AROM and ulnar N glide  And elbow sleeve

## 2018-03-05 NOTE — Therapy (Signed)
[place] [facility] [address] [place], [address] Phone: [phone]   Fax:  [phone]  Occupational Therapy Treatment  Patient Details  Name: Nicholas Cabrera MRN: [id number] Date of Birth: 01-10-1966 Referring Provider: Tamala Ser   Encounter Date: 03/05/2018  OT End of Session - 03/05/18 1854    Visit Number  4    Number of Visits  8    Date for OT Re-Evaluation  03/10/18    OT Start Time  1202    OT Stop Time  1240    OT Time Calculation (min)  38 min    Activity Tolerance  Patient tolerated treatment well    Behavior During Therapy  [facility] for tasks assessed/performed       Past Medical History:  Diagnosis Date  . Asthma   . Depression   . Diabetes ([place])   . Hx of diverticulitis of colon   . Hypertension   . Hypothyroidism   . Hypothyroidism   . Low testosterone   . Obesity   . Obesity   . OSA (obstructive sleep apnea)   . Testicular hypofunction   . Vitamin D deficiency     Past Surgical History:  Procedure Laterality Date  . CLOSED REDUCTION FINGER WITH PERCUTANEOUS PINNING Left 10/23/2017   Procedure: CLOSED REDUCTION FINGER WITH PERCUTANEOUS PINNING-LEFT FIFTH;  Surgeon: Hessie Knows, MD;  Location: [facility] ORS;  Service: Orthopedics;  Laterality: Left;  . COLONOSCOPY    . POLYPECTOMY      There were no vitals filed for this visit.  Subjective Assessment - 03/05/18 1850    Subjective   I had the nerve conduction test done - and it hurt - it showed compression of nerve at elbow - have appt with Dr Rudene Christians next Wed - if I don't wear the elbow sleeve then my elbow and arm hurt - otherwise okay - can make fist - still sore on side of my hand when used a lot and still numb     Patient Stated Goals  I want to be able to use my hand again so I can do my work without issues, play guitar, use my hand in activities at home     Currently in Pain?  Yes    Pain Score  2     Pain Location  Hand    Pain  Orientation  Left    Pain Descriptors / Indicators  Sore    Pain Type  Surgical pain    Pain Onset  More than a month ago         [facility] OT Assessment - 03/05/18 0001      Strength   Right Hand Grip (lbs)  80    Right Hand Lateral Pinch  20 lbs    Right Hand 3 Point Pinch  11 lbs    Left Hand Grip (lbs)  44    Left Hand Lateral Pinch  14 lbs    Left Hand 3 Point Pinch  11 lbs               OT Treatments/Exercises (OP) - 03/05/18 0001      LUE Fluidotherapy   Number Minutes Fluidotherapy  8 Minutes    LUE Fluidotherapy Location  Hand;Wrist    Comments  at [facility] to decrease pain at wrist and hand        Soft tissue massage to webspace  ulnar side of hand , carpal spreads, gentle traction  and joint mobs to 4th and 5th MC's  Graston tool nr 2 over palm , volar digits and volar forearm -sweeping  Tightness and tenderness ulnar side and volar palm - on 4th and 5th MC's  Done some xtractor on both pin holes   wrist prayer stretch - cont at home  But add to HEP and done in clinic AAROM and PROM over edge of table for wrist flexion and extention  12 reps   Cont with  Ulnar N glide 16 oz hammer  For wrist sup /pro, RD, UD and flexion and ext  10 reps each   upgrade to teal putty for gripping  And pulling with all digits   should be pain free and stop prior to feeling pull  10 reps   2 x day   an increase in 3 days if not increase pain -can do 2 sets   Wartenberg sign neg and Froments  Neg   Tinel positive and tender over Cubital tunnel      HEP   Add PROM and AAROM to wrist flexion , ext,  1 lbs weight or 16 oz hammer for sup /pro, RD, UD and wrist flexion , ext  10 reps each   2 x day  Cont with puttty upgrade to teal  And cont AROM and ulnar N glide  And elbow slee    OT Education - 03/05/18 1853    Education provided  Yes    Education Details  wrist HEP     Person(s) Educated  Patient    Methods  Explanation;Demonstration;Tactile cues;Verbal  cues;Handout    Comprehension  Verbalized understanding;Returned demonstration       OT Short Term Goals - 02/10/18 1749      OT SHORT TERM GOAL #1   Title  Pain on PRWHE improve with more than 20 points     Baseline  at eval PRWHE pain score are 33/50     Time  2    Period  Weeks    Status  New    Target Date  02/24/18      OT SHORT TERM GOAL #2   Title  L wrist AROM improve to [facility] without pain report increase use     Baseline  wrist decrease in all planes - see flowsheet - and pain     Time  3    Period  Weeks    Status  New    Target Date  03/03/18      OT SHORT TERM GOAL #3   Title  Pt to be independent in HEP to decrease pain , numbness - increase AROM to use with pain less than 4/10     Baseline  pain increase to 8/10 - numbness constrant - deep pressure on hypothenar eminence     Time  3    Period  Weeks    Status  New    Target Date  03/03/18        OT Long Term Goals - 02/10/18 1752      OT LONG TERM GOAL #1   Title  L hand digits AROM improve for pt to make composite fist with no pain     Baseline  L hand AROM in all digits and all joints decrease - see flowsheet     Time  4    Period  Weeks    Status  New    Target Date  03/10/18      OT LONG TERM  GOAL #2   Title  L grip strength increase to more than 50% compare to R hand to increase function score more than 15 points     Baseline  Grip L hand 18 lbs. L 62 lbs - Function score on PRWHE 34.5/50     Time  4    Period  Weeks    Status  New    Target Date  03/10/18            Plan - 03/05/18 1854    Clinical Impression Statement  Pt made great progress in digits AROM , pain , and grip strength - pt still tender over Cubital tunnel , and numbness on ulnar side of hand - but negative Wartenberg sign, and Froments -  great  prehension strength - still tender over ulnar side of hand with gripping , and A1pulley of 4th but not triggering -  wrist still stiff - add HEP for wrist this date - and increase  putty - would recommend ionto with dexamethazone for cubital tunnel and maybe also for 4th 1pulley -  pt to see MD on Wed      Occupational performance deficits (Please refer to evaluation for details):  IADL's;Work;Play;Leisure    Rehab Potential  Good    Current Impairments/barriers affecting progress:   Pt had numbness from start - pt report no improvement - and went back to work 7 wks - 12 hrs shifts     OT Frequency  2x / week    OT Duration  4 weeks    OT Treatment/Interventions  Splinting;Fluidtherapy;Self-care/ADL training;Contrast Bath;Ultrasound;Therapeutic exercise;Scar mobilization;Passive range of motion;Paraffin;Manual Therapy    Plan  results from MD visit - if can do ionto on cubital tunnel - assess grip and wrist - after new HEP     OT Home Exercise Plan  see pt instruction     Consulted and Agree with Plan of Care  Patient       Patient will benefit from skilled therapeutic intervention in order to improve the following deficits and impairments:  Pain, Impaired flexibility, Decreased coordination, Decreased scar mobility, Impaired sensation, Decreased strength, Decreased range of motion, Impaired UE functional use  Visit Diagnosis: Pain in left hand  Pain in left wrist  Numbness and tingling in left hand  Stiffness of left hand, not elsewhere classified  Muscle weakness (generalized)  Stiffness of left wrist, not elsewhere classified    Problem List Patient Active Problem List   Diagnosis Date Noted  . Hypomagnesemia 05/21/2016  . Fatigue 05/21/2016  . Hypogonadism in male 05/21/2016  . Vitamin D deficiency 05/21/2016  . Erectile dysfunction 05/21/2016  . Poor circulation of extremity 04/24/2016  . Diverticulitis 08/12/2015  . Obstructive sleep apnea 07/01/2015  . Type 2 diabetes mellitus, uncontrolled ([place])   . Hypertension   . Hyperlipidemia   . Hypothyroidism   . Obesity   . Depression     Makalyn Lennox, Gwenette Greet OTR/L,CLT 03/05/2018, 7:03 PM  [place] [facility] [address] [place], [address] Phone: [phone]   Fax:  [phone]  Name: Nicholas Cabrera MRN: [id number] Date of Birth: 10-Nov-1966

## 2018-03-11 ENCOUNTER — Ambulatory Visit: Payer: BC Managed Care – PPO | Admitting: Occupational Therapy

## 2018-03-11 DIAGNOSIS — R202 Paresthesia of skin: Secondary | ICD-10-CM

## 2018-03-11 DIAGNOSIS — M6281 Muscle weakness (generalized): Secondary | ICD-10-CM

## 2018-03-11 DIAGNOSIS — M25532 Pain in left wrist: Secondary | ICD-10-CM

## 2018-03-11 DIAGNOSIS — M79642 Pain in left hand: Secondary | ICD-10-CM

## 2018-03-11 DIAGNOSIS — M25642 Stiffness of left hand, not elsewhere classified: Secondary | ICD-10-CM

## 2018-03-11 DIAGNOSIS — R200 Anesthesia of skin: Secondary | ICD-10-CM

## 2018-03-11 DIAGNOSIS — M25632 Stiffness of left wrist, not elsewhere classified: Secondary | ICD-10-CM

## 2018-03-11 NOTE — Patient Instructions (Signed)
Pt to cont until surgery with his HEP   but then after surgery ask surgeon to refer to therapy  Prior to going back to work this time

## 2018-03-11 NOTE — Therapy (Signed)
[place] [facility] [address] [place], [address] Phone: [phone]   Fax:  [phone]  Occupational Therapy Treatment  Patient Details  Name: Nicholas Cabrera MRN: [id number] Date of Birth: 10/19/1966 Referring Provider: Tamala Ser   Encounter Date: 03/11/2018  OT End of Session - 03/11/18 1935    Visit Number  5    Number of Visits  8    Date for OT Re-Evaluation  03/10/18    OT Start Time  1151    OT Stop Time  1233    OT Time Calculation (min)  42 min    Activity Tolerance  Patient tolerated treatment well    Behavior During Therapy  [facility] for tasks assessed/performed       Past Medical History:  Diagnosis Date  . Asthma   . Depression   . Diabetes ([place])   . Hx of diverticulitis of colon   . Hypertension   . Hypothyroidism   . Hypothyroidism   . Low testosterone   . Obesity   . Obesity   . OSA (obstructive sleep apnea)   . Testicular hypofunction   . Vitamin D deficiency     Past Surgical History:  Procedure Laterality Date  . CLOSED REDUCTION FINGER WITH PERCUTANEOUS PINNING Left 10/23/2017   Procedure: CLOSED REDUCTION FINGER WITH PERCUTANEOUS PINNING-LEFT FIFTH;  Surgeon: Hessie Knows, MD;  Location: [facility] ORS;  Service: Orthopedics;  Laterality: Left;  . COLONOSCOPY    . POLYPECTOMY      There were no vitals filed for this visit.  Subjective Assessment - 03/11/18 1931    Subjective   I just seen Dr Rudene Christians - and going to do surgery on my Cubital tunnel on the 21st March and trigger finger release on my 5th I think at the same time - - otherwise doing okay - still numb and some achiness on my side of hand if I grip a lot and stiffness at wrist - but better - doing the hammer exercises     Patient Stated Goals  I want to be able to use my hand again so I can do my work without issues, play guitar, use my hand in activities at home     Currently in Pain?  Yes    Pain Score  2     Pain  Location  Hand    Pain Orientation  Left    Pain Descriptors / Indicators  Aching    Pain Type  Surgical pain    Pain Onset  More than a month ago         [facility] OT Assessment - 03/11/18 0001      Strength   Right Hand Grip (lbs)  80    Right Hand Lateral Pinch  20 lbs    Right Hand 3 Point Pinch  11 lbs    Left Hand Grip (lbs)  45    Left Hand Lateral Pinch  16 lbs    Left Hand 3 Point Pinch  11 lbs       Pt still numb on ulnar side of hand  And weakness with ADD of 5th  Froment's negative    Pt grip about the same  Fisting WNL  And progress with Wrist AROM   with slight pull at end range  Pt to cont with HEP until Cubital tunnel release and Trigger finger release  And then wait until follow up with MD for stitches removal and ask  if can follow up with me prior to going back to work   Pt had questions about rehab and recovery after surgery   answer questions                    OT Education - 03/11/18 1935    Education provided  Yes    Education Details  HEP until surgery and then modifications after surgery     Person(s) Educated  Patient    Methods  Explanation;Demonstration;Tactile cues    Comprehension  Verbalized understanding       OT Short Term Goals - 03/11/18 1940      OT SHORT TERM GOAL #1   Title  Pain on PRWHE improve with more than 20 points     Baseline  at eval PRWHE pain score are 33/50  and now only 9/10    Status  Achieved      OT SHORT TERM GOAL #2   Title  L wrist AROM improve to [facility] without pain report increase use     Baseline  feel slight pull     Time  3    Period  Weeks    Status  On-going    Target Date  03/25/18      OT SHORT TERM GOAL #3   Title  Pt to be independent in HEP to decrease pain , numbness - increase AROM to use with pain less than 4/10     Baseline  all improve except numbness -having cubital tunnel release next week     Status  Achieved        OT Long Term Goals - 03/11/18 1942      OT LONG  TERM GOAL #1   Title  L hand digits AROM improve for pt to make composite fist with no pain     Status  Achieved      OT LONG TERM GOAL #2   Status  Achieved            Plan - 03/11/18 1937    Clinical Impression Statement  Pt showed great progress in pain ,AROM for diigits flexion , ext , wrist and grip /prehension strength since [facility] - but numbness the same and pain over A1pulley at 4th and 5th - pt seen surgeon this am and scheduled for Cubital tunnel release  next week and trigger finger release - pt to be seen if refer after surgery prior to returning to work this time after surgery    Occupational performance deficits (Please refer to evaluation for details):  IADL's;Work;Play;Leisure    Rehab Potential  Good    Current Impairments/barriers affecting progress:   Pt had numbness from start - pt report no improvement - and went back to work 7 wks - 12 hrs shifts     Plan  Pt to phone after surgery - if surgeon refer back     OT Home Exercise Plan  cont until surgery same HEP     Consulted and Agree with Plan of Care  Patient       Patient will benefit from skilled therapeutic intervention in order to improve the following deficits and impairments:  Pain, Impaired flexibility, Decreased coordination, Decreased scar mobility, Impaired sensation, Decreased strength, Decreased range of motion, Impaired UE functional use  Visit Diagnosis: Pain in left hand  Pain in left wrist  Numbness and tingling in left hand  Stiffness of left hand, not elsewhere classified  Muscle weakness (generalized)  Stiffness  of left wrist, not elsewhere classified    Problem List Patient Active Problem List   Diagnosis Date Noted  . Hypomagnesemia 05/21/2016  . Fatigue 05/21/2016  . Hypogonadism in male 05/21/2016  . Vitamin D deficiency 05/21/2016  . Erectile dysfunction 05/21/2016  . Poor circulation of extremity 04/24/2016  . Diverticulitis 08/12/2015  . Obstructive sleep apnea  07/01/2015  . Type 2 diabetes mellitus, uncontrolled ([place])   . Hypertension   . Hyperlipidemia   . Hypothyroidism   . Obesity   . Depression     Rukaya Kleinschmidt, Gwenette Greet OTR/l,CLT 03/11/2018, 7:43 PM  [place] [facility] [address], [place], [address] Phone: [phone]   Fax:  [phone]  Name: Nicholas Cabrera MRN: [id number] Date of Birth: [birth year]

## 2018-03-13 ENCOUNTER — Other Ambulatory Visit: Payer: Self-pay

## 2018-03-13 ENCOUNTER — Encounter
Admission: RE | Admit: 2018-03-13 | Discharge: 2018-03-13 | Disposition: A | Discharge status: Home / Self Care | Payer: BC Managed Care – PPO | Source: Ambulatory Visit | Attending: Orthopedic Surgery | Admitting: Orthopedic Surgery

## 2018-03-13 DIAGNOSIS — E119 Type 2 diabetes mellitus without complications: Secondary | ICD-10-CM | POA: Diagnosis not present

## 2018-03-13 DIAGNOSIS — I10 Essential (primary) hypertension: Secondary | ICD-10-CM | POA: Insufficient documentation

## 2018-03-13 DIAGNOSIS — Z01810 Encounter for preprocedural cardiovascular examination: Secondary | ICD-10-CM | POA: Diagnosis not present

## 2018-03-13 DIAGNOSIS — G4733 Obstructive sleep apnea (adult) (pediatric): Secondary | ICD-10-CM | POA: Diagnosis not present

## 2018-03-13 NOTE — Patient Instructions (Signed)
Your procedure is scheduled on: Thursday 03/19/18 Report to [facility]. To find out your arrival time please call [phone] between 1PM - 3PM on Wednesday 03/18/18.  Remember: Instructions that are not followed completely may result in serious medical risk, up to and including death, or upon the discretion of your surgeon and anesthesiologist your surgery may need to be rescheduled.     _X__ 1. Do not eat food after midnight the night before your procedure.                 No gum chewing or hard candies. You may drink clear liquids up to 2 hours                 before you are scheduled to arrive for your surgery- DO not drink clear                 liquids within 2 hours of the start of your surgery.                 Clear Liquids include:  water, apple juice without pulp, clear carbohydrate                 drink such as Clearfast or Gatorade, Black Coffee or Tea (Do not add                 anything to coffee or tea).  __X__2.  On the morning of surgery brush your teeth with toothpaste and water, you                 may rinse your mouth with mouthwash if you wish.  Do not swallow any              toothpaste of mouthwash.     _X__ 3.  No Alcohol for 24 hours before or after surgery.   _X__ 4.  Do Not Smoke or use e-cigarettes For 24 Hours Prior to Your Surgery.                 Do not use any chewable tobacco products for at least 6 hours prior to                 surgery.  ____  5.  Bring all medications with you on the day of surgery if instructed.   __X__  6.  Notify your doctor if there is any change in your medical condition      (cold, fever, infections).     Do not wear jewelry, make-up, hairpins, clips or nail polish. Do not wear lotions, powders, or perfumes.  Do not shave 48 hours prior to surgery. Men may shave face and neck. Do not bring valuables to the hospital.    [facility] is not responsible for any belongings or  valuables.  Contacts, dentures/partials or body piercings may not be worn into surgery. Bring a case for your contacts, glasses or hearing aids, a denture cup will be supplied. Leave your suitcase in the car. After surgery it may be brought to your room. For patients admitted to the hospital, discharge time is determined by your treatment team.   Patients discharged the day of surgery will not be allowed to drive home.   Please read over the following fact sheets that you were given:   MRSA Information  __X__ Take these medicines the morning of surgery with A SIP OF WATER:  1. amlodipine  2. escitalopram  3. levothyroxine  4. metoprolol  5.  6.  ____ Fleet Enema (as directed)   __X__ Use CHG Soap/SAGE wipes as directed  ____ Use inhalers on the day of surgery  __X__ Stop metformin/Janumet/Farxiga 2 days prior to surgery  XIGDUO HAS METFORMIN IN IT  ____ Take 1/2 of usual insulin dose the night before surgery. No insulin the morning          of surgery.   __X__ Stop Blood Thinners Coumadin/Plavix/Xarelto/Pleta/Pradaxa/Eliquis/Effient/Aspirin  on TODAY   Or contact your Surgeon, Cardiologist or Medical Doctor regarding  ability to stop your blood thinners  __X__ Stop Anti-inflammatories 7 days before surgery such as Advil, Ibuprofen, Motrin,  BC or Goodies Powder, Naprosyn, Naproxen, Aleve, Aspirin    __X__ Stopall herbal supplements, fish oil or vitamin E until after surgery.    __X__ Bring C-Pap to the hospital.

## 2018-03-16 ENCOUNTER — Encounter: Payer: BC Managed Care – PPO | Admitting: Occupational Therapy

## 2018-03-19 ENCOUNTER — Encounter
Admission: RE | Disposition: A | Discharge status: Home / Self Care | Payer: Self-pay | Source: Ambulatory Visit | Attending: Orthopedic Surgery

## 2018-03-19 ENCOUNTER — Encounter: Payer: Self-pay | Admitting: Emergency Medicine

## 2018-03-19 ENCOUNTER — Ambulatory Visit: Payer: BC Managed Care – PPO | Admitting: Certified Registered"

## 2018-03-19 ENCOUNTER — Ambulatory Visit
Admission: RE | Admit: 2018-03-19 | Discharge: 2018-03-19 | Disposition: A | Discharge status: Home / Self Care | Payer: BC Managed Care – PPO | Source: Ambulatory Visit | Attending: Orthopedic Surgery | Admitting: Orthopedic Surgery

## 2018-03-19 ENCOUNTER — Ambulatory Visit: Payer: BC Managed Care – PPO | Admitting: Occupational Therapy

## 2018-03-19 DIAGNOSIS — E119 Type 2 diabetes mellitus without complications: Secondary | ICD-10-CM | POA: Diagnosis not present

## 2018-03-19 DIAGNOSIS — F329 Major depressive disorder, single episode, unspecified: Secondary | ICD-10-CM | POA: Insufficient documentation

## 2018-03-19 DIAGNOSIS — Z833 Family history of diabetes mellitus: Secondary | ICD-10-CM | POA: Diagnosis not present

## 2018-03-19 DIAGNOSIS — Z79899 Other long term (current) drug therapy: Secondary | ICD-10-CM | POA: Insufficient documentation

## 2018-03-19 DIAGNOSIS — Z7982 Long term (current) use of aspirin: Secondary | ICD-10-CM | POA: Insufficient documentation

## 2018-03-19 DIAGNOSIS — Z8249 Family history of ischemic heart disease and other diseases of the circulatory system: Secondary | ICD-10-CM | POA: Diagnosis not present

## 2018-03-19 DIAGNOSIS — Z7951 Long term (current) use of inhaled steroids: Secondary | ICD-10-CM | POA: Diagnosis not present

## 2018-03-19 DIAGNOSIS — E669 Obesity, unspecified: Secondary | ICD-10-CM | POA: Insufficient documentation

## 2018-03-19 DIAGNOSIS — J45909 Unspecified asthma, uncomplicated: Secondary | ICD-10-CM | POA: Insufficient documentation

## 2018-03-19 DIAGNOSIS — Z7984 Long term (current) use of oral hypoglycemic drugs: Secondary | ICD-10-CM | POA: Insufficient documentation

## 2018-03-19 DIAGNOSIS — G4733 Obstructive sleep apnea (adult) (pediatric): Secondary | ICD-10-CM | POA: Insufficient documentation

## 2018-03-19 DIAGNOSIS — K219 Gastro-esophageal reflux disease without esophagitis: Secondary | ICD-10-CM | POA: Insufficient documentation

## 2018-03-19 DIAGNOSIS — M65352 Trigger finger, left little finger: Secondary | ICD-10-CM | POA: Diagnosis not present

## 2018-03-19 DIAGNOSIS — Z79890 Hormone replacement therapy (postmenopausal): Secondary | ICD-10-CM | POA: Diagnosis not present

## 2018-03-19 DIAGNOSIS — E559 Vitamin D deficiency, unspecified: Secondary | ICD-10-CM | POA: Insufficient documentation

## 2018-03-19 DIAGNOSIS — G5622 Lesion of ulnar nerve, left upper limb: Secondary | ICD-10-CM | POA: Diagnosis present

## 2018-03-19 DIAGNOSIS — Z823 Family history of stroke: Secondary | ICD-10-CM | POA: Diagnosis not present

## 2018-03-19 DIAGNOSIS — Z6838 Body mass index (BMI) 38.0-38.9, adult: Secondary | ICD-10-CM | POA: Diagnosis not present

## 2018-03-19 DIAGNOSIS — I10 Essential (primary) hypertension: Secondary | ICD-10-CM | POA: Insufficient documentation

## 2018-03-19 HISTORY — PX: ULNAR TUNNEL RELEASE: SHX820

## 2018-03-19 HISTORY — PX: TRIGGER FINGER RELEASE: SHX641

## 2018-03-19 LAB — GLUCOSE, CAPILLARY
Glucose-Capillary: 148 mg/dL — ABNORMAL HIGH (ref 65–99)
Glucose-Capillary: 132 mg/dL — ABNORMAL HIGH (ref 65–99)

## 2018-03-19 SURGERY — RELEASE, CUBITAL TUNNEL
Anesthesia: General | Site: Wrist | Laterality: Left | Wound class: "Clean "

## 2018-03-19 MED ORDER — MIDAZOLAM HCL 2 MG/2ML IJ SOLN
INTRAMUSCULAR | Status: DC | PRN
  Administered 2018-03-19: 2 mg via INTRAVENOUS

## 2018-03-19 MED ORDER — LIDOCAINE HCL (PF) 1 % IJ SOLN
INTRAMUSCULAR | Status: AC
  Filled 2018-03-19: qty 30

## 2018-03-19 MED ORDER — OXYCODONE HCL 5 MG PO TABS
5.0000 mg | ORAL_TABLET | Freq: Once | ORAL | Status: AC | PRN
  Administered 2018-03-19: 5 mg via ORAL

## 2018-03-19 MED ORDER — DEXAMETHASONE SODIUM PHOSPHATE 10 MG/ML IJ SOLN
INTRAMUSCULAR | Status: DC | PRN
  Administered 2018-03-19: 4 mg via INTRAVENOUS

## 2018-03-19 MED ORDER — SODIUM CHLORIDE 0.9 % IV SOLN
INTRAVENOUS | Status: DC
  Administered 2018-03-19: 14:00:00 via INTRAVENOUS

## 2018-03-19 MED ORDER — GLYCOPYRROLATE 0.2 MG/ML IJ SOLN
INTRAMUSCULAR | Status: DC | PRN
  Administered 2018-03-19: 0.2 mg via INTRAVENOUS

## 2018-03-19 MED ORDER — FENTANYL CITRATE (PF) 100 MCG/2ML IJ SOLN
INTRAMUSCULAR | Status: DC | PRN
  Administered 2018-03-19 (×2): 50 ug via INTRAVENOUS

## 2018-03-19 MED ORDER — OXYCODONE HCL 5 MG/5ML PO SOLN: 5.0000 mg | Freq: Once | ORAL | Status: AC | PRN

## 2018-03-19 MED ORDER — METOCLOPRAMIDE HCL 10 MG PO TABS: 5.0000 mg | ORAL_TABLET | Freq: Three times a day (TID) | ORAL | Status: DC | PRN

## 2018-03-19 MED ORDER — KETOROLAC TROMETHAMINE 30 MG/ML IJ SOLN
INTRAMUSCULAR | Status: DC | PRN
  Administered 2018-03-19: 30 mg via INTRAVENOUS

## 2018-03-19 MED ORDER — OXYCODONE HCL 5 MG PO TABS: 5.0000 mg | ORAL_TABLET | ORAL | 0 refills | Status: AC | PRN

## 2018-03-19 MED ORDER — ONDANSETRON HCL 4 MG/2ML IJ SOLN
INTRAMUSCULAR | Status: AC
  Filled 2018-03-19: qty 2

## 2018-03-19 MED ORDER — OXYCODONE HCL 5 MG PO TABS
ORAL_TABLET | ORAL | Status: AC
  Administered 2018-03-19: 5 mg via ORAL
  Filled 2018-03-19: qty 1

## 2018-03-19 MED ORDER — PHENYLEPHRINE HCL 10 MG/ML IJ SOLN
INTRAMUSCULAR | Status: DC | PRN
  Administered 2018-03-19: 100 ug via INTRAVENOUS

## 2018-03-19 MED ORDER — SODIUM CHLORIDE 0.9 % IV SOLN: INTRAVENOUS | Status: DC

## 2018-03-19 MED ORDER — FENTANYL CITRATE (PF) 100 MCG/2ML IJ SOLN: 25.0000 ug | INTRAMUSCULAR | Status: DC | PRN

## 2018-03-19 MED ORDER — PROPOFOL 10 MG/ML IV BOLUS
INTRAVENOUS | Status: DC | PRN
  Administered 2018-03-19: 50 mg via INTRAVENOUS
  Administered 2018-03-19: 150 mg via INTRAVENOUS

## 2018-03-19 MED ORDER — LACTATED RINGERS IV SOLN
INTRAVENOUS | Status: DC | PRN
  Administered 2018-03-19: 14:00:00 via INTRAVENOUS

## 2018-03-19 MED ORDER — BUPIVACAINE HCL (PF) 0.5 % IJ SOLN
INTRAMUSCULAR | Status: DC | PRN
  Administered 2018-03-19: 10 mL

## 2018-03-19 MED ORDER — FAMOTIDINE 20 MG PO TABS
ORAL_TABLET | ORAL | Status: AC
  Administered 2018-03-19: 20 mg via ORAL
  Filled 2018-03-19: qty 1

## 2018-03-19 MED ORDER — FENTANYL CITRATE (PF) 100 MCG/2ML IJ SOLN
INTRAMUSCULAR | Status: AC
  Filled 2018-03-19: qty 2

## 2018-03-19 MED ORDER — ONDANSETRON HCL 4 MG/2ML IJ SOLN
INTRAMUSCULAR | Status: DC | PRN
  Administered 2018-03-19: 4 mg via INTRAVENOUS

## 2018-03-19 MED ORDER — BUPIVACAINE HCL (PF) 0.5 % IJ SOLN
INTRAMUSCULAR | Status: AC
  Filled 2018-03-19: qty 30

## 2018-03-19 MED ORDER — FAMOTIDINE 20 MG PO TABS
20.0000 mg | ORAL_TABLET | Freq: Once | ORAL | Status: AC
  Administered 2018-03-19: 20 mg via ORAL

## 2018-03-19 MED ORDER — OXYCODONE HCL 5 MG PO TABS: 5.0000 mg | ORAL_TABLET | ORAL | Status: AC | PRN

## 2018-03-19 MED ORDER — MIDAZOLAM HCL 2 MG/2ML IJ SOLN
INTRAMUSCULAR | Status: AC
  Filled 2018-03-19: qty 2

## 2018-03-19 MED ORDER — METOCLOPRAMIDE HCL 5 MG/ML IJ SOLN: 5.0000 mg | Freq: Three times a day (TID) | INTRAMUSCULAR | Status: DC | PRN

## 2018-03-19 MED ORDER — ONDANSETRON HCL 4 MG/2ML IJ SOLN: 4.0000 mg | Freq: Four times a day (QID) | INTRAMUSCULAR | Status: DC | PRN

## 2018-03-19 SURGICAL SUPPLY — 32 items
BANDAGE ACE 3X5.8 VEL STRL LF (GAUZE/BANDAGES/DRESSINGS) ×4 IMPLANT
BANDAGE ELASTIC 2 LF NS (GAUZE/BANDAGES/DRESSINGS) ×4 IMPLANT
BNDG ELASTIC 2X5.8 VLCR STR LF (GAUZE/BANDAGES/DRESSINGS) ×4 IMPLANT
CANISTER SUCT 1200ML W/VALVE (MISCELLANEOUS) ×4 IMPLANT
CAST PADDING 2X4YD ST 30245 (MISCELLANEOUS) ×2
CHLORAPREP W/TINT 26ML (MISCELLANEOUS) ×4 IMPLANT
CUFF TOURN 18 STER (MISCELLANEOUS) IMPLANT
ELECT CAUTERY NDL 2.0 MIC (NEEDLE) IMPLANT
ELECT CAUTERY NEEDLE 2.0 MIC (NEEDLE) IMPLANT
GAUZE PETRO XEROFOAM 1X8 (MISCELLANEOUS) ×4 IMPLANT
GAUZE SPONGE 4X4 12PLY STRL (GAUZE/BANDAGES/DRESSINGS) ×4 IMPLANT
GLOVE SURG SYN 9.0  PF PI (GLOVE) ×2
GLOVE SURG SYN 9.0 PF PI (GLOVE) ×2 IMPLANT
GOWN SRG 2XL LVL 4 RGLN SLV (GOWNS) ×2 IMPLANT
GOWN STRL NON-REIN 2XL LVL4 (GOWNS) ×2
GOWN STRL REUS W/ TWL LRG LVL3 (GOWN DISPOSABLE) ×2 IMPLANT
GOWN STRL REUS W/TWL LRG LVL3 (GOWN DISPOSABLE) ×2
KIT TURNOVER KIT A (KITS) ×4 IMPLANT
NDL HYPO 25X1 1.5 SAFETY (NEEDLE) ×2 IMPLANT
NEEDLE HYPO 25X1 1.5 SAFETY (NEEDLE) ×4 IMPLANT
NS IRRIG 500ML POUR BTL (IV SOLUTION) ×4 IMPLANT
PACK EXTREMITY ARMC (MISCELLANEOUS) ×4 IMPLANT
PAD CAST CTTN 4X4 STRL (SOFTGOODS) ×2 IMPLANT
PADDING CAST COTTON 2X4 ST (MISCELLANEOUS) ×2 IMPLANT
PADDING CAST COTTON 4X4 STRL (SOFTGOODS) ×2
SCALPEL PROTECTED #15 DISP (BLADE) ×8 IMPLANT
SPONGE GAUZE 2X2 8PLY STER LF (GAUZE/BANDAGES/DRESSINGS) ×1
SPONGE GAUZE 2X2 8PLY STRL LF (GAUZE/BANDAGES/DRESSINGS) ×3 IMPLANT
SUT ETHILON 4 0 P 3 18 (SUTURE) ×4 IMPLANT
SUT ETHILON 4-0 (SUTURE) ×2
SUT ETHILON 4-0 FS2 18XMFL BLK (SUTURE) ×2
SUTURE ETHLN 4-0 FS2 18XMF BLK (SUTURE) ×2 IMPLANT

## 2018-03-19 NOTE — H&P (Signed)
Reviewed paper H+P, will be scanned into chart. No changes noted.  

## 2018-03-19 NOTE — Op Note (Signed)
03/19/2018  3:23 PM  PATIENT:  Nicholas Cabrera  52 y.o. male  PRE-OPERATIVE DIAGNOSIS:  CUBITAL TUNNEL RELEASE LEFT, TRIGGER FINGER LEFT LITTLE FINGER  POST-OPERATIVE DIAGNOSIS:  CUBITAL TUNNEL RELEASE LEFT, TRIGGER FINGER LEFT LITTLE FINGER  PROCEDURE:  Procedure(s): CUBITAL TUNNEL RELEASE (Left) RELEASE TRIGGER FINGER/A-1 PULLEY-LEFT LITTLE FINGER (Left)  SURGEON: Laurene Footman, MD  ASSISTANTS: None  ANESTHESIA:   general  EBL:  Total I/O In: 500 [I.V.:500] Out: -   BLOOD ADMINISTERED:none  DRAINS: none   LOCAL MEDICATIONS USED:  MARCAINE    and Amount: 10  ml  SPECIMEN:  No Specimen  DISPOSITION OF SPECIMEN:  N/A  COUNTS:  YES  TOURNIQUET:  * Missing tourniquet times found for documented tourniquets in log: [id number] *  IMPLANTS: None  DICTATION: .Dragon Dictation patient brought the operating room and after adequate anesthesia was obtained the left arm was prepped review sterile fashion.  After patient identification timeout procedure tourniquet was raised in the cubital tunnel release was done first with incision posterior to the medial epicondyle since he obtains tissue was spread and the cubital tunnel opened behind the medial epicondyle release carried out proximally and distally distally until the ulnar nerve went into the F see you muscle with compression being noted proximal to the medial epicondyle release there showed some hourglass constriction going proximally there was no further constriction or compression proximal to the medial epicondyle the ulnar nerve was unstable and was brought anteriorly as a subcutaneous transposition with 2-0 Vicryl used to reapproximate this to the subcutaneous tissue and skin followed by 4-0 nylon for the skin with 10 cc of half percent Sensorcaine infiltrated for postop analgesia.  Going to the hand 1 cm incision was made overlying the A1 pulley of the little finger subcutaneous tissue spread and retractors placed to protect the  neurovascular bundles.  The pulley was identified and incised proximally there was swelling of the tendon in the more proximal portion after release of the A1 pulley there was full range of motion and no catching tendon itself was intact without fraying the wound was closed with simple interrupted 4-0 nylon both wounds were then closed covered with Xeroform 4 x 4's web roll and Ace wraps patient sent to recovery in stable condition  PLAN OF CARE: Discharge to home after PACU  PATIENT DISPOSITION:  PACU - hemodynamically stable.

## 2018-03-19 NOTE — Anesthesia Post-op Follow-up Note (Signed)
Anesthesia QCDR form completed.        

## 2018-03-19 NOTE — Transfer of Care (Signed)
Immediate Anesthesia Transfer of Care Note  Patient: Nicholas Cabrera  Procedure(s) Performed: CUBITAL TUNNEL RELEASE (Left Wrist) RELEASE TRIGGER FINGER/A-1 PULLEY-LEFT LITTLE FINGER (Left Hand)  Patient Location: PACU  Anesthesia Type:General  Level of Consciousness: sedated  Airway & Oxygen Therapy: Patient Spontanous Breathing and Patient connected to face mask oxygen  Post-op Assessment: Report given to RN and Post -op Vital signs reviewed and stable  Post vital signs: Reviewed and stable  Last Vitals:  Vitals Value Taken Time  BP 121/93 03/19/2018  3:28 PM  Temp    Pulse 86 03/19/2018  3:28 PM  Resp 8 03/19/2018  3:28 PM  SpO2 98 % 03/19/2018  3:28 PM  Vitals shown include unvalidated device data.  Last Pain:  Vitals:   03/19/18 1326  TempSrc: Oral  PainSc: 2          Complications: No apparent anesthesia complications

## 2018-03-19 NOTE — Anesthesia Preprocedure Evaluation (Signed)
Anesthesia Evaluation  Patient identified by MRN, date of birth, ID band Patient awake    Reviewed: Allergy & Precautions, H&P , NPO status , Patient's Chart, lab work & pertinent test results  History of Anesthesia Complications Negative for: history of anesthetic complications  Airway Mallampati: III  TM Distance: >3 FB Neck ROM: full    Dental  (+) Chipped, Poor Dentition, Missing   Pulmonary neg pulmonary ROS, neg shortness of breath, asthma , sleep apnea and Continuous Positive Airway Pressure Ventilation , former smoker,           Cardiovascular Exercise Tolerance: Good hypertension, (-) angina+ Peripheral Vascular Disease  (-) Past MI and (-) DOE      Neuro/Psych PSYCHIATRIC DISORDERS Depression negative neurological ROS     GI/Hepatic negative GI ROS, Neg liver ROS, neg GERD  ,  Endo/Other  diabetes, Type 2Hypothyroidism   Renal/GU      Musculoskeletal   Abdominal   Peds  Hematology negative hematology ROS (+)   Anesthesia Other Findings Past Medical History: No date: Asthma No date: Depression No date: Diabetes ([place]) No date: Hx of diverticulitis of colon No date: Hypertension No date: Hypothyroidism No date: Hypothyroidism No date: Low testosterone No date: Obesity No date: Obesity No date: OSA (obstructive sleep apnea) No date: Testicular hypofunction No date: Vitamin D deficiency  Past Surgical History: 10/23/2017: CLOSED REDUCTION FINGER WITH PERCUTANEOUS PINNING; Left     Comment:  Procedure: CLOSED REDUCTION FINGER WITH PERCUTANEOUS               PINNING-LEFT FIFTH;  Surgeon: Hessie Knows, MD;                Location: [facility] ORS;  Service: Orthopedics;  Laterality:               Left; No date: COLONOSCOPY No date: POLYPECTOMY  BMI    Body Mass Index:  38.40 kg/m      Reproductive/Obstetrics negative OB ROS                             Anesthesia  Physical Anesthesia Plan  ASA: III  Anesthesia Plan: General   Post-op Pain Management:    Induction: Intravenous  PONV Risk Score and Plan: Ondansetron, Dexamethasone and Midazolam  Airway Management Planned: LMA  Additional Equipment:   Intra-op Plan:   Post-operative Plan: Extubation in OR  Informed Consent: I have reviewed the patients History and Physical, chart, labs and discussed the procedure including the risks, benefits and alternatives for the proposed anesthesia with the patient or authorized representative who has indicated his/her understanding and acceptance.   Dental Advisory Given  Plan Discussed with: Anesthesiologist, CRNA and Surgeon  Anesthesia Plan Comments: (Patient consented for risks of anesthesia including but not limited to:  - adverse reactions to medications - damage to teeth, lips or other oral mucosa - sore throat or hoarseness - Damage to heart, brain, lungs or loss of life  Patient voiced understanding.)        Anesthesia Quick Evaluation

## 2018-03-19 NOTE — Discharge Instructions (Signed)
Finger range of motion but do not worry about elbow motion this weekend, sling for comfort.  Pain medicine as directed.  Loosen Ace wrap if fingers swell a great deal

## 2018-03-19 NOTE — Anesthesia Procedure Notes (Signed)
Procedure Name: LMA Insertion Date/Time: 03/19/2018 2:26 PM Performed by: Rolla Plate, CRNA Pre-anesthesia Checklist: Patient identified, Patient being monitored, Timeout performed, Emergency Drugs available and Suction available Patient Re-evaluated:Patient Re-evaluated prior to induction Oxygen Delivery Method: Circle system utilized Preoxygenation: Pre-oxygenation with 100% oxygen Induction Type: IV induction Ventilation: Mask ventilation without difficulty LMA: LMA inserted LMA Size: 5.0 Number of attempts: 1 Placement Confirmation: positive ETCO2 and breath sounds checked- equal and bilateral Tube secured with: Tape Dental Injury: Teeth and Oropharynx as per pre-operative assessment

## 2018-03-20 ENCOUNTER — Encounter: Payer: Self-pay | Admitting: Orthopedic Surgery

## 2018-03-20 NOTE — Anesthesia Postprocedure Evaluation (Signed)
Anesthesia Post Note  Patient: Mayco Walrond  Procedure(s) Performed: CUBITAL TUNNEL RELEASE (Left Wrist) RELEASE TRIGGER FINGER/A-1 PULLEY-LEFT LITTLE FINGER (Left Hand)  Patient location during evaluation: PACU Anesthesia Type: General Level of consciousness: awake and alert Pain management: pain level controlled Vital Signs Assessment: post-procedure vital signs reviewed and stable Respiratory status: spontaneous breathing, nonlabored ventilation, respiratory function stable and patient connected to nasal cannula oxygen Cardiovascular status: blood pressure returned to baseline and stable Postop Assessment: no apparent nausea or vomiting Anesthetic complications: no     Last Vitals:  Vitals:   03/19/18 1615 03/19/18 1639  BP: (!) 140/96 (!) 142/95  Pulse: 85   Resp: 16 16  Temp:    SpO2: 95%     Last Pain:  Vitals:   03/19/18 1626  TempSrc:   PainSc: 4                  Joseph K Piscitello

## 2018-04-06 ENCOUNTER — Ambulatory Visit: Payer: BC Managed Care – PPO | Attending: Orthopedic Surgery | Admitting: Occupational Therapy

## 2018-04-06 DIAGNOSIS — R200 Anesthesia of skin: Secondary | ICD-10-CM | POA: Diagnosis present

## 2018-04-06 DIAGNOSIS — M79642 Pain in left hand: Secondary | ICD-10-CM | POA: Insufficient documentation

## 2018-04-06 DIAGNOSIS — L905 Scar conditions and fibrosis of skin: Secondary | ICD-10-CM | POA: Diagnosis present

## 2018-04-06 DIAGNOSIS — R202 Paresthesia of skin: Secondary | ICD-10-CM | POA: Diagnosis present

## 2018-04-06 DIAGNOSIS — M62831 Muscle spasm of calf: Secondary | ICD-10-CM | POA: Insufficient documentation

## 2018-04-06 DIAGNOSIS — M6281 Muscle weakness (generalized): Secondary | ICD-10-CM

## 2018-04-06 DIAGNOSIS — G5622 Lesion of ulnar nerve, left upper limb: Secondary | ICD-10-CM | POA: Diagnosis present

## 2018-04-06 NOTE — Patient Instructions (Signed)
Ulnar N glide  Can do contrast over Cubital tunnel scar  And scar massage gentle - and textures - like towel   Also scar massage over trigger finger scar  cica scar pad for night time  Tapping of digits  And tendon glides  Avoid flexion more than 90 degrees , propping elbow and repetitive flexion , ext  And avoid vibration   wear elbow pad still

## 2018-04-06 NOTE — Therapy (Signed)
[facility] [address] [place], [address] Phone: [phone]   Fax:  [phone]  Occupational Therapy Treatment/ [facility]   Patient Details  Name: Nicholas Cabrera MRN: [id number] Date of Birth: 1966/07/29 Referring Provider: Tamala Ser   Encounter Date: 04/06/2018  OT End of Session - 04/06/18 1520    Visit Number  1    Number of Visits  6    Date for OT Re-Evaluation  05/18/18    OT Start Time  1055    OT Stop Time  1140    OT Time Calculation (min)  45 min    Activity Tolerance  Patient tolerated treatment well    Behavior During Therapy  [facility] for tasks assessed/performed       Past Medical History:  Diagnosis Date  . Asthma   . Depression   . Diabetes ([place])   . Hx of diverticulitis of colon   . Hypertension   . Hypothyroidism   . Hypothyroidism   . Low testosterone   . Obesity   . Obesity   . OSA (obstructive sleep apnea)   . Testicular hypofunction   . Vitamin D deficiency     Past Surgical History:  Procedure Laterality Date  . CLOSED REDUCTION FINGER WITH PERCUTANEOUS PINNING Left 10/23/2017   Procedure: CLOSED REDUCTION FINGER WITH PERCUTANEOUS PINNING-LEFT FIFTH;  Surgeon: Hessie Knows, MD;  Location: [facility] ORS;  Service: Orthopedics;  Laterality: Left;  . COLONOSCOPY    . POLYPECTOMY    . TRIGGER FINGER RELEASE Left 03/19/2018   Procedure: RELEASE TRIGGER FINGER/A-1 PULLEY-LEFT LITTLE FINGER;  Surgeon: Hessie Knows, MD;  Location: [facility] ORS;  Service: Orthopedics;  Laterality: Left;  . ULNAR TUNNEL RELEASE Left 03/19/2018   Procedure: CUBITAL TUNNEL RELEASE;  Surgeon: Hessie Knows, MD;  Location: [facility] ORS;  Service: Orthopedics;  Laterality: Left;    There were no vitals filed for this visit.  Subjective Assessment - 04/06/18 1514    Subjective   My numbness in L pinkie is gone , and finger do not trigger - still tender and scar tissue in palm at incision -and then tender  over both - I fell last weekend on both arms and was bruised and swollen when I seen the PA - still taking ibriuprohen - doing okay     Patient Stated Goals  I want to be able to go back to work and have my life back - using my arm - plyaing guitar, going to [place] in middle May -     Currently in Pain?  Yes    Pain Score  2     Pain Location  Elbow    Pain Orientation  Left    Pain Descriptors / Indicators  Tender    Pain Type  Surgical pain    Pain Onset  1 to 4 weeks ago       Pt seen for reeval after  L Cubital tunnel release and L 5th  trigger finger release   strerri strips removed on both scar  And ed on scar massage  AROM assess  And ed on HEP   hand out provided and review     Ulnar N glide  Can do contrast over Cubital tunnel scar  And scar massage gentle - and textures - like towel   Also scar massage over trigger finger scar  cica scar pad for night time  Tapping of digits  And tendon glides  Avoid flexion  more than 90 degrees , propping elbow and repetitive flexion , ext  And avoid vibration   wear elbow pad still                  OT Education - 04/06/18 1520    Education provided  Yes    Education Details  findings of reeval and HEP     Person(s) Educated  Patient    Methods  Explanation;Demonstration;Tactile cues;Verbal cues;Handout    Comprehension  Returned demonstration;Verbalized understanding       OT Short Term Goals - 04/06/18 1525      OT SHORT TERM GOAL #1   Title  Pt to be ind in HEP for Ulnar N glide , modifications to return to work and IADL's to prevent ulnar N irritation    Baseline  very little knowledge     Time  2    Period  Weeks    Status  On-going    Target Date  04/20/18      OT SHORT TERM GOAL #3   Title  Pt to be independent in HEP to decrease pain , numbness - increase AROM to use with pain less than 2 /10     Baseline  Denies numbness in 5th since surgery , pain about 2-3/10 - tenderness     Time  3     Period  Weeks    Status  On-going    Target Date  04/27/18        OT Long Term Goals - 04/06/18 1527      OT LONG TERM GOAL #1   Title  Scar tissue improve to not tenderness and tolerate different texture  in L hand     Baseline  sterristrips removed this date -and initiate scar ed     Time  3    Period  Weeks    Status  New    Target Date  04/27/18      OT LONG TERM GOAL #2   Title  L grip strength increase to more than 50% compare to R hand to increase use of L hand to more than 5 lbs to lift and carry     Baseline  NT pt less than 3 wks out from surgery     Time  5    Period  Weeks    Status  New    Target Date  05/11/18            Plan - 04/06/18 1521    Clinical Impression Statement  Pt had 3/21 L cubital tunnel release with 5th trigger finger release done - pt did fall about 10 days ago and still bruised in upper arm and some swelling but report it feels better- PA gave him some  anti inflammatory  -  pt show increase scar tissue in palm , and tenderness over both scars - AROM  WFL - stretch or pull at end range elbow AROM and 5 th digit - decrease strength - pt ed on act modifications and HEP for cubital tunnel more than  trigger finger - pt return to work about 16th April     Occupational performance deficits (Please refer to evaluation for details):  IADL's;Work;Play;Leisure    Rehab Potential  Good    OT Frequency  -- 1-2 x wk     OT Duration  6 weeks    OT Treatment/Interventions  Splinting;Fluidtherapy;Self-care/ADL training;Contrast Bath;Ultrasound;Therapeutic exercise;Scar mobilization;Passive range of motion;Paraffin;Manual Therapy    Plan  progress with HEP and modify as needed     Clinical Decision Making  Limited treatment options, no task modification necessary    OT Home Exercise Plan  HEP see pt instruction     Consulted and Agree with Plan of Care  Patient       Patient will benefit from skilled therapeutic intervention in order to improve the  following deficits and impairments:  Pain, Impaired flexibility, Decreased coordination, Decreased scar mobility, Impaired sensation, Decreased strength, Decreased range of motion, Impaired UE functional use  Visit Diagnosis: Muscle weakness (generalized) - Plan: Ot plan of care cert/re-cert  Cubital canal compression syndrome, left - Plan: Ot plan of care cert/re-cert  Scar condition and fibrosis of skin - Plan: Ot plan of care cert/re-cert    Problem List Patient Active Problem List   Diagnosis Date Noted  . Hypomagnesemia 05/21/2016  . Fatigue 05/21/2016  . Hypogonadism in male 05/21/2016  . Vitamin D deficiency 05/21/2016  . Erectile dysfunction 05/21/2016  . Poor circulation of extremity 04/24/2016  . Diverticulitis 08/12/2015  . Obstructive sleep apnea 07/01/2015  . Type 2 diabetes mellitus, uncontrolled ([place])   . Hypertension   . Hyperlipidemia   . Hypothyroidism   . Obesity   . Depression     Charmeka [place]burg, Gwenette Greet OTR/L,CLT 04/06/2018, 3:33 PM  [facility] [address], [place], [address] Phone: [phone]   Fax:  [phone]  Name: Rohith Fauth MRN: [id number] Date of Birth: Jun 11, 1966

## 2018-04-08 ENCOUNTER — Ambulatory Visit: Payer: BC Managed Care – PPO | Admitting: Occupational Therapy

## 2018-04-08 DIAGNOSIS — M6281 Muscle weakness (generalized): Secondary | ICD-10-CM

## 2018-04-08 DIAGNOSIS — L905 Scar conditions and fibrosis of skin: Secondary | ICD-10-CM

## 2018-04-08 DIAGNOSIS — M79642 Pain in left hand: Secondary | ICD-10-CM

## 2018-04-08 DIAGNOSIS — G5622 Lesion of ulnar nerve, left upper limb: Secondary | ICD-10-CM

## 2018-04-08 NOTE — Therapy (Signed)
[facility] [address], [place], [address] Phone: [phone]   Fax:  [phone]  Occupational Therapy Treatment  Patient Details  Name: Nicholas Cabrera MRN: [id number] Date of Birth: [birth year] Referring Provider: Tamala Ser   Encounter Date: 04/08/2018  OT End of Session - 04/08/18 1744    Visit Number  2    Number of Visits  6    Date for OT Re-Evaluation  05/18/18    OT Start Time  1043    OT Stop Time  1125    OT Time Calculation (min)  42 min    Activity Tolerance  Patient tolerated treatment well    Behavior During Therapy  [facility]) for tasks assessed/performed       Past Medical History:  Diagnosis Date  . Asthma   . Depression   . Diabetes ([place])   . Hx of diverticulitis of colon   . Hypertension   . Hypothyroidism   . Hypothyroidism   . Low testosterone   . Obesity   . Obesity   . OSA (obstructive sleep apnea)   . Testicular hypofunction   . Vitamin D deficiency     Past Surgical History:  Procedure Laterality Date  . CLOSED REDUCTION FINGER WITH PERCUTANEOUS PINNING Left 10/23/2017   Procedure: CLOSED REDUCTION FINGER WITH PERCUTANEOUS PINNING-LEFT FIFTH;  Surgeon: Hessie Knows, MD;  Location: [facility];  Service: Orthopedics;  Laterality: Left;  . COLONOSCOPY    . POLYPECTOMY    . TRIGGER FINGER RELEASE Left 03/19/2018   Procedure: RELEASE TRIGGER FINGER/A-1 PULLEY-LEFT LITTLE FINGER;  Surgeon: Hessie Knows, MD;  Location: [facility] OR[facility];  Service: Orthopedics;  Laterality: Left;  . ULNAR TUNNEL RELEASE Left 03/19/2018   Procedure: CUBITAL TUNNEL RELEASE;  Surgeon: Hessie Knows, MD;  Location: [facility];  Service: Orthopedics;  Laterality: Left;    There were no vitals filed for this visit.  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  No/denies                   OT Treatments/Exercises (OP) - 04/08/18 0001      LUE Paraffin   Number Minutes Paraffin  10 Minutes    LUE Paraffin Location  Hand    Comments  prior to scar massage        strerri strips removed on elbow scar   And ed on scar massage  And desentitization   Ulnar N glide  Review -  Can cont to do  contrast over Cubital tunnel scar  And scar massage gentle - and textures - like towel over both scars   Also scar massage over trigger finger scar after paraffin - still swollen  cica scar pad for night time  Tapping of digits  And tendon glides  Add elbow flexion pain free- AROM   Avoid sustained  flexion more than 90 degrees , propping elbow and repetitive flexion , ext  And avoid vibration   wear elbow pad -  made new one for pt this date     [place] at 3.3 MHZ , 20 % and 0.8 intensity at end over trigger finger scar to decrease edema     OT Education - 04/08/18 1743    Education provided  Yes    Education Details  review HEP again     [employer]) Educated  Patient    Methods  Explanation;Demonstration;Tactile cues    Comprehension  Verbalized understanding;Returned demonstration       OT Short Term Goals - 04/06/18 1525      OT SHORT TERM GOAL #1   Title  Pt to be ind in HEP for Ulnar N glide , modifications to return to work and IADL's to prevent ulnar N irritation    Baseline  very little knowledge     Time  2    Period  Weeks    Status  On-going    Target Date  04/20/18      OT SHORT TERM GOAL #3   Title  Pt to be independent in HEP to decrease pain , numbness - increase AROM to use with pain less than 2 /10     Baseline  Denies numbness in 5th since surgery , pain about 2-3/10 - tenderness     Time  3    Period   Weeks    Status  On-going    Target Date  04/27/18        OT Long Term Goals - 04/06/18 1527      OT LONG TERM GOAL #1   Title  Scar tissue improve to not tenderness and tolerate different texture  in L hand     Baseline  sterristrips removed this date -and initiate scar ed     Time  3    Period  Weeks    Status  New    Target Date  04/27/18      OT LONG TERM GOAL #2   Title  L grip strength increase to more than 50% compare to R hand to increase use of L hand to more than 5 lbs to lift and carry     Baseline  NT pt less than 3 wks out from surgery     Time  5    Period  Weeks    Status  New    Target Date  05/11/18            Plan - 04/08/18 1744    Clinical Impression Statement  Pt sterri strips removed on elbow and pt still some swelling - but no pain and numbness - made new elbow pad for pt to wear and pt to cont with elbow extention and Nerve glide - but also add AROM for elbow flexion - but pain free - and do scar massage to hand and elbow scar and cica scar pads for night time     Occupational performance deficits (Please refer to evaluation for details):  IADL's;Work;Play;Leisure    Rehab Potential  Good    Current Impairments/barriers affecting progress:   Pt had numbness from start - pt report no improvement - and went back to work 7 wks - 12 hrs shifts     OT Frequency  -- 1-2 x wk    OT Duration  6 weeks    OT Treatment/Interventions  Splinting;Fluidtherapy;Self-care/ADL training;Contrast Bath;Ultrasound;Therapeutic exercise;Scar mobilization;Passive range of motion;Paraffin;Manual Therapy    Plan  cont to increase AROM , functional use and strength per protocol  Clinical Decision Making  Limited treatment options, no task modification necessary    OT Home Exercise Plan  HEP see pt instruction     Consulted and Agree with Plan of Care  Patient       Patient will benefit from skilled therapeutic intervention in order to improve the following deficits and  impairments:  Pain, Impaired flexibility, Decreased coordination, Decreased scar mobility, Impaired sensation, Decreased strength, Decreased range of motion, Impaired UE functional use  Visit Diagnosis: Muscle weakness (generalized)  Cubital canal compression syndrome, left  Scar condition and fibrosis of skin  Pain in left hand    Problem List Patient Active Problem List   Diagnosis Date Noted  . Hypomagnesemia 05/21/2016  . Fatigue 05/21/2016  . Hypogonadism in male 05/21/2016  . Vitamin D deficiency 05/21/2016  . Erectile dysfunction 05/21/2016  . Poor circulation of extremity 04/24/2016  . Diverticulitis 08/12/2015  . Obstructive sleep apnea 07/01/2015  . Type 2 diabetes mellitus, uncontrolled ([place])   . Hypertension   . Hyperlipidemia   . Hypothyroidism   . Obesity   . Depression     Rosalyn Gess OTR/L,CLT 04/08/2018, 5:47 PM  [facility] [address], [place], [address] Phone: [phone]   Fax:  [phone]  Name: Nicholas Cabrera MRN: [id number] Date of Birth: 1966/06/26

## 2018-04-08 NOTE — Patient Instructions (Signed)
Add elbow flexion AROM   And made new compression elbow sleeve for L - to decrease compression and decrease flexion at night time cica scar pad provided for L elbow too

## 2018-04-09 ENCOUNTER — Ambulatory Visit: Payer: BC Managed Care – PPO

## 2018-04-09 DIAGNOSIS — M62831 Muscle spasm of calf: Secondary | ICD-10-CM

## 2018-04-09 NOTE — Therapy (Signed)
[facility] [address] [place], [address] Phone: [phone]   Fax:  [phone]  Patient Details  Name: Nicholas Cabrera MRN: [id number] Date of Birth: 1966-01-04 Referring Provider:  Idelle Crouch, MD  Encounter Date: 04/09/2018   PT/OT/SLP Screening Form   Time: in: 1315  Time out: 1352   Complaint Leg Cramps  Past Medical Hx:  DM, LBP, B Knee pain Injury Date: Started 2 years prior Pain Scale: 0/10 Patient's phone number:   Hx (this occurrence):  Patient demonstrates increased spasms along B calfs and hamstrings for 22 years. 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 States his magnesium is a little low but still within WNL. Patient reports he has not been taking magnesium tablets    Assessment: Assessed MMT: All LE and Hip MMT WNL EXCEPT: B Hip ER: 4-/5 B, Hip Extension: 4/5, Hip Abd: 4/5; Plantarflexion on R: 4/5; plantarflexion on L: 4+/5 AROM: 25% limited with lumbar extension; Hip extension B: 5 deg Palpation: Increased spasms along B gastrocs and hamstrings   Poor mechanics with squatting and single leg stance    Recommendations:    Comments: Full PT eval with treamtent to help decrease spasms and improve hip strength and lumbar stability     []  Patient would benefit from an MD referral [x]  Patient would benefit from a full PT/OT/ SLP evaluation and treatment. []  No intervention recommended at this time.    Blythe Stanford, PT DPT 04/09/2018, 1:20 PM  [facility] [address], [place], [address] Phone: [phone]   Fax:  [phone]

## 2018-04-13 ENCOUNTER — Ambulatory Visit: Payer: BC Managed Care – PPO | Admitting: Occupational Therapy

## 2018-04-13 DIAGNOSIS — M6281 Muscle weakness (generalized): Secondary | ICD-10-CM | POA: Diagnosis not present

## 2018-04-13 DIAGNOSIS — G5622 Lesion of ulnar nerve, left upper limb: Secondary | ICD-10-CM

## 2018-04-13 DIAGNOSIS — R200 Anesthesia of skin: Secondary | ICD-10-CM

## 2018-04-13 DIAGNOSIS — L905 Scar conditions and fibrosis of skin: Secondary | ICD-10-CM

## 2018-04-13 DIAGNOSIS — R202 Paresthesia of skin: Secondary | ICD-10-CM

## 2018-04-13 NOTE — Patient Instructions (Signed)
Cont with Nerve glides Contrast to elbow  Ice to trigger finger scar -and cont scar pad and massage   Tendon glides  And elbow pad as needed  And avoid positions or movements that aggravate elbow

## 2018-04-13 NOTE — Therapy (Signed)
[facility] [address], [place], [address] Phone: [phone]   Fax:  [phone]  Occupational Therapy Treatment  Patient Details  Name: Nicholas Cabrera MRN: [id number] Date of Birth: 03-05-1966 Referring Provider: Tamala Ser   Encounter Date: 04/13/2018  OT End of Session - 04/13/18 1410    Visit Number  3    Number of Visits  6    Date for OT Re-Evaluation  05/18/18    OT Start Time  1316    OT Stop Time  1345    OT Time Calculation (min)  29 min    Activity Tolerance  Patient tolerated treatment well    Behavior During Therapy  [facility] for tasks assessed/performed       Past Medical History:  Diagnosis Date  . Asthma   . Depression   . Diabetes ([place])   . Hx of diverticulitis of colon   . Hypertension   . Hypothyroidism   . Hypothyroidism   . Low testosterone   . Obesity   . Obesity   . OSA (obstructive sleep apnea)   . Testicular hypofunction   . Vitamin D deficiency     Past Surgical History:  Procedure Laterality Date  . CLOSED REDUCTION FINGER WITH PERCUTANEOUS PINNING Left 10/23/2017   Procedure: CLOSED REDUCTION FINGER WITH PERCUTANEOUS PINNING-LEFT FIFTH;  Surgeon: Hessie Knows, MD;  Location: [facility];  Service: Orthopedics;  Laterality: Left;  . COLONOSCOPY    . POLYPECTOMY    . TRIGGER FINGER RELEASE Left 03/19/2018   Procedure: RELEASE TRIGGER FINGER/A-1 PULLEY-LEFT LITTLE FINGER;  Surgeon: Hessie Knows, MD;  Location: [facility];  Service: Orthopedics;  Laterality: Left;  . ULNAR TUNNEL RELEASE Left 03/19/2018   Procedure: CUBITAL TUNNEL RELEASE;  Surgeon: Hessie Knows, MD;  Location: [facility];  Service: Orthopedics;  Laterality: Left;    There were no vitals filed for this visit.  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  No/denies         [facility] OT Assessment - 04/13/18 0001      Strength   Right Hand Grip (lbs)  80    Right Hand Lateral Pinch  20 lbs    Right Hand 3 Point Pinch  14 lbs    Left Hand Grip (lbs)  61    Left Hand Lateral Pinch  18 lbs    Left Hand 3 Point Pinch  14 lbs      Grip strength increase and prehension in L hand   AROM WNL in L elbow , wrist and hand   Review Nerve glides - no pull - doing great  Assess Semmes Weinstein - on L elbow - decrease over medial elbow able to feel 4.31 and up  Hand normal sensation   review with pt again scar massage to palmar hand - feel softer  Still thick but improving - pt to cont with cica scar pad for night time  Cont nerve glides  And review again positioning of L elbow when returning to work tomorrow                   OT Education - 04/13/18 1410    Education provided  Yes    Education Details  HEP and return to work -    [employer]) Educated  Patient    Methods  Explanation;Demonstration    Comprehension  Returned demonstration       OT Short Term Goals - 04/06/18 [place] #1   Title  Pt to be ind in HEP for Ulnar N glide , modifications to return to work and IADL's to prevent ulnar N irritation    Baseline  very little knowledge     Time  2    Period  Weeks    Status  On-going    Target Date  04/20/18      OT SHORT TERM GOAL #3   Title  Pt to be independent in HEP to decrease pain , numbness - increase AROM to use with pain less than 2 /10     Baseline  Denies numbness in 5th since surgery , pain about 2-3/10 - tenderness     Time  3    Period  Weeks    Status  On-going    Target Date  04/27/18        OT Long Term Goals - 04/06/18 1527      OT LONG TERM  GOAL #1   Title  Scar tissue improve to not tenderness and tolerate different texture  in L hand     Baseline  sterristrips removed this date -and initiate scar ed     Time  3    Period  Weeks    Status  New    Target Date  04/27/18      OT LONG TERM GOAL #2   Title  L grip strength increase to more than 50% compare to R hand to increase use of L hand to more than 5 lbs to lift and carry     Baseline  NT pt less than 3 wks out from surgery     Time  5    Period  Weeks    Status  New    Target Date  05/11/18            Plan - 04/13/18 1411    Clinical Impression Statement  Pt's AROM  in L hand and elbow WNL - strength at L elbow 4/5- but will initiate at 6 wks - after returning to work tomorrow - pt will return in about 2 wks - pt to cont with Nerve glides - scar management to palmar scar - and  avoid aggravated position to L elbow when returning to work     Occupational performance deficits (Please refer to evaluation for details):  IADL's;Work;Play;Leisure    Rehab Potential  Good    Current Impairments/barriers affecting progress:   Pt had numbness from start - pt report no improvement - and went back to work 7 wks - 12 hrs shifts     OT Frequency  Biweekly    OT Duration  4 weeks    OT Treatment/Interventions  Splinting;Fluidtherapy;Self-care/ADL training;Contrast Bath;Ultrasound;Therapeutic exercise;Scar mobilization;Passive range of motion;Paraffin;Manual Therapy    Plan  assess how done returning to work - possible strengthening add     Clinical Decision Making  Limited treatment options, no task modification necessary  OT Home Exercise Plan  HEP see pt instruction     Consulted and Agree with Plan of Care  Patient       Patient will benefit from skilled therapeutic intervention in order to improve the following deficits and impairments:  Pain, Impaired flexibility, Decreased coordination, Decreased scar mobility, Impaired sensation, Decreased strength, Decreased range  of motion, Impaired UE functional use  Visit Diagnosis: Cubital canal compression syndrome, left  Scar condition and fibrosis of skin  Numbness and tingling in left hand    Problem List Patient Active Problem List   Diagnosis Date Noted  . Hypomagnesemia 05/21/2016  . Fatigue 05/21/2016  . Hypogonadism in male 05/21/2016  . Vitamin D deficiency 05/21/2016  . Erectile dysfunction 05/21/2016  . Poor circulation of extremity 04/24/2016  . Diverticulitis 08/12/2015  . Obstructive sleep apnea 07/01/2015  . Type 2 diabetes mellitus, uncontrolled ([place])   . Hypertension   . Hyperlipidemia   . Hypothyroidism   . Obesity   . Depression     Marquitta Persichetti, Gwenette Greet OTR/l,CLT 04/13/2018, 2:14 PM  [facility] [address], [place], [address] Phone: [phone]   Fax:  [phone]  Name: Chandra Feger MRN: [id number] Date of Birth: March 13, 1966

## 2018-04-30 ENCOUNTER — Ambulatory Visit: Payer: BC Managed Care – PPO | Attending: Orthopedic Surgery | Admitting: Occupational Therapy

## 2018-04-30 DIAGNOSIS — R200 Anesthesia of skin: Secondary | ICD-10-CM | POA: Diagnosis present

## 2018-04-30 DIAGNOSIS — G5622 Lesion of ulnar nerve, left upper limb: Secondary | ICD-10-CM | POA: Insufficient documentation

## 2018-04-30 DIAGNOSIS — L905 Scar conditions and fibrosis of skin: Secondary | ICD-10-CM | POA: Diagnosis present

## 2018-04-30 DIAGNOSIS — R202 Paresthesia of skin: Secondary | ICD-10-CM | POA: Insufficient documentation

## 2018-04-30 NOTE — Patient Instructions (Signed)
Cont to use hand gradually more - not prop elbow up - cont scar mobs to trigger finger scar and cica scar pad

## 2018-04-30 NOTE — Therapy (Signed)
[facility] [address], [place], [address] Phone: [phone]   Fax:  [phone]  Occupational Therapy Treatment  Patient Details  Name: Nicholas Cabrera MRN: [id number] Date of Birth: 1966/12/04 Referring Provider: Tamala Ser   Encounter Date: 04/30/2018  OT End of Session - 04/30/18 2015    Visit Number  4    Number of Visits  6    Date for OT Re-Evaluation  05/18/18    OT Start Time  1355    OT Stop Time  1432    OT Time Calculation (min)  37 min    Activity Tolerance  Patient tolerated treatment well    Behavior During Therapy  [facility] for tasks assessed/performed       Past Medical History:  Diagnosis Date  . Asthma   . Depression   . Diabetes ([place])   . Hx of diverticulitis of colon   . Hypertension   . Hypothyroidism   . Hypothyroidism   . Low testosterone   . Obesity   . Obesity   . OSA (obstructive sleep apnea)   . Testicular hypofunction   . Vitamin D deficiency     Past Surgical History:  Procedure Laterality Date  . CLOSED REDUCTION FINGER WITH PERCUTANEOUS PINNING Left 10/23/2017   Procedure: CLOSED REDUCTION FINGER WITH PERCUTANEOUS PINNING-LEFT FIFTH;  Surgeon: Hessie Knows, MD;  Location: [facility];  Service: Orthopedics;  Laterality: Left;  . COLONOSCOPY    . POLYPECTOMY    . TRIGGER FINGER RELEASE Left 03/19/2018   Procedure: RELEASE TRIGGER FINGER/A-1 PULLEY-LEFT LITTLE FINGER;  Surgeon: Hessie Knows, MD;  Location: [facility];  Service: Orthopedics;  Laterality: Left;  . ULNAR TUNNEL RELEASE Left 03/19/2018   Procedure: CUBITAL TUNNEL RELEASE;  Surgeon: Hessie Knows, MD;  Location: [facility];  Service: Orthopedics;  Laterality: Left;    There were no vitals filed for this visit.  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  No/denies         [facility] OT Assessment - 04/30/18 0001      Strength   Right Hand Grip (lbs)  100    Right Hand Lateral Pinch  24 lbs    Right Hand 3 Point Pinch  15 lbs    Left Hand Grip (lbs)  75    Left Hand Lateral Pinch  19 lbs    Left Hand 3 Point Pinch  15 lbs      Strength in L  wrist , elbow and shoulder  - m/m  5/5 Tenderness and numbness small area over cubital tunnel  And over scar - trigger finger scar still thick  responded great to vibration -and provide pt with cica scar pad to use for month at night time   Done PREE with pt and similuated -  Function 6/50 , and pain 8/50                  OT Education - 04/30/18 2014    Education provided  Yes    Education Details  scar mobs and massage and modify positioning elbow     Person(s) Educated  Patient    Methods  Explanation;Demonstration    Comprehension  Verbalized understanding;Returned demonstration       OT Short Term Goals - 04/30/18 2018      OT SHORT TERM GOAL #1   Title  Pt to be ind in HEP for Ulnar N glide , modifications to return to work and IADL's to prevent ulnar N irritation    Status  Achieved      OT SHORT TERM GOAL #2   Title  L wrist AROM improve to [facility] without pain report increase use     Status  Achieved      OT SHORT TERM GOAL #3   Title  Pt to be independent in HEP to decrease pain , numbness - increase AROM to use with pain less than 2 /10     Status  Achieved        OT Long Term Goals - 04/30/18 2018      OT LONG TERM GOAL #1   Title  Scar tissue improve to not tenderness and tolerate different texture  in L hand     Baseline  still tender over both scars     Time  1    Period  Months    Status  On-going    Target Date  05/28/18      OT LONG TERM GOAL #2   Title  L grip strength  increase to more than 50% compare to R hand to increase use of L hand to more than 5 lbs to lift and carry     Status  Achieved            Plan - 04/30/18 2016    Clinical Impression Statement  Pt  show AROM at L elbow and hand WNL - strength 5/5 - and grip increase - pt return to work and has no issues- modify act and sitting with elbow- pt still tender and thick over trigger finger scar -provided pt with cica scar pad -and to wear and can return in 4 wks if needed     Occupational performance deficits (Please refer to evaluation for details):  IADL's;Work;Play;Leisure    Rehab Potential  Good    Current Impairments/barriers affecting progress:   Pt had numbness from start - pt report no improvement - and went back to work 7 wks - 12 hrs shifts     OT Frequency  Monthly    OT Duration  4 weeks    OT Treatment/Interventions  Splinting;Fluidtherapy;Self-care/ADL training;Contrast Bath;Ultrasound;Therapeutic exercise;Scar mobilization;Passive range of motion;Paraffin;Manual Therapy    Plan  assess in month - scar on volar hand and tenderness /numbness at elbow     Clinical Decision Making  Limited treatment options, no task modification necessary    OT Home Exercise Plan  HEP see pt instruction     Consulted and Agree with Plan of Care  Patient       Patient will benefit from skilled therapeutic intervention in order to improve the following deficits and impairments:  Pain, Impaired flexibility, Decreased coordination, Decreased scar mobility, Impaired sensation, Decreased strength, Decreased range of motion, Impaired UE functional use  Visit Diagnosis: Cubital canal compression syndrome, left  Scar condition and fibrosis of skin  Numbness and tingling in left hand    Problem List Patient Active Problem List   Diagnosis Date Noted  . Hypomagnesemia 05/21/2016  . Fatigue 05/21/2016  .  Hypogonadism in male 05/21/2016  . Vitamin D deficiency 05/21/2016  . Erectile dysfunction  05/21/2016  . Poor circulation of extremity 04/24/2016  . Diverticulitis 08/12/2015  . Obstructive sleep apnea 07/01/2015  . Type 2 diabetes mellitus, uncontrolled ([place])   . Hypertension   . Hyperlipidemia   . Hypothyroidism   . Obesity   . Depression     Noemie Devivo, Gwenette Greet OTR/L,CLT 04/30/2018, 8:20 PM  [facility] [address], [place], [address] Phone: [phone]   Fax:  [phone]  Name: Nicholas Cabrera MRN: [id number] Date of Birth: 08-06-1966

## 2019-09-14 ENCOUNTER — Other Ambulatory Visit: Payer: Self-pay | Admitting: Internal Medicine

## 2019-09-14 DIAGNOSIS — G44209 Tension-type headache, unspecified, not intractable: Secondary | ICD-10-CM

## 2019-09-20 ENCOUNTER — Ambulatory Visit
Admission: RE | Admit: 2019-09-20 | Discharge: 2019-09-20 | Disposition: A | Discharge status: Home / Self Care | Payer: BC Managed Care – PPO | Source: Ambulatory Visit | Attending: Internal Medicine | Admitting: Internal Medicine

## 2019-09-20 ENCOUNTER — Other Ambulatory Visit: Payer: Self-pay

## 2019-09-20 DIAGNOSIS — G44209 Tension-type headache, unspecified, not intractable: Secondary | ICD-10-CM | POA: Diagnosis not present

## 2019-09-24 ENCOUNTER — Emergency Department: Payer: BC Managed Care – PPO

## 2019-09-24 ENCOUNTER — Other Ambulatory Visit: Payer: Self-pay

## 2019-09-24 ENCOUNTER — Emergency Department
Admission: EM | Admit: 2019-09-24 | Discharge: 2019-09-24 | Disposition: A | Discharge status: Home / Self Care | Payer: BC Managed Care – PPO | Attending: Emergency Medicine | Admitting: Emergency Medicine

## 2019-09-24 DIAGNOSIS — Z87891 Personal history of nicotine dependence: Secondary | ICD-10-CM | POA: Insufficient documentation

## 2019-09-24 DIAGNOSIS — I10 Essential (primary) hypertension: Secondary | ICD-10-CM | POA: Diagnosis not present

## 2019-09-24 DIAGNOSIS — Z79899 Other long term (current) drug therapy: Secondary | ICD-10-CM | POA: Insufficient documentation

## 2019-09-24 DIAGNOSIS — E039 Hypothyroidism, unspecified: Secondary | ICD-10-CM | POA: Insufficient documentation

## 2019-09-24 DIAGNOSIS — Z7982 Long term (current) use of aspirin: Secondary | ICD-10-CM | POA: Diagnosis not present

## 2019-09-24 DIAGNOSIS — J45909 Unspecified asthma, uncomplicated: Secondary | ICD-10-CM | POA: Insufficient documentation

## 2019-09-24 DIAGNOSIS — R0602 Shortness of breath: Secondary | ICD-10-CM | POA: Insufficient documentation

## 2019-09-24 DIAGNOSIS — E119 Type 2 diabetes mellitus without complications: Secondary | ICD-10-CM | POA: Insufficient documentation

## 2019-09-24 LAB — CBC
HCT: 52.3 % — ABNORMAL HIGH (ref 39.0–52.0)
Hemoglobin: 17.5 g/dL — ABNORMAL HIGH (ref 13.0–17.0)
MCH: 28.6 pg (ref 26.0–34.0)
MCHC: 33.5 g/dL (ref 30.0–36.0)
MCV: 85.5 fL (ref 80.0–100.0)
Platelets: 249 10*3/uL (ref 150–400)
RBC: 6.12 MIL/uL — ABNORMAL HIGH (ref 4.22–5.81)
RDW: 13.1 % (ref 11.5–15.5)
WBC: 10.2 10*3/uL (ref 4.0–10.5)
nRBC: 0 % (ref 0.0–0.2)

## 2019-09-24 LAB — BASIC METABOLIC PANEL
Anion gap: 10 (ref 5–15)
BUN: 14 mg/dL (ref 6–20)
CO2: 32 mmol/L (ref 22–32)
Calcium: 9.4 mg/dL (ref 8.9–10.3)
Chloride: 97 mmol/L — ABNORMAL LOW (ref 98–111)
Creatinine, Ser: 0.8 mg/dL (ref 0.61–1.24)
GFR calc Af Amer: >60 mL/min (ref >60)
GFR calc non Af Amer: >60 mL/min (ref >60)
Glucose, Bld: 123 mg/dL — ABNORMAL HIGH (ref 70–99)
Potassium: 3.5 mmol/L (ref 3.5–5.1)
Sodium: 139 mmol/L (ref 135–145)

## 2019-09-24 LAB — TROPONIN I (HIGH SENSITIVITY): Troponin I (High Sensitivity): 7 ng/L (ref <18)

## 2019-09-24 MED ORDER — SODIUM CHLORIDE 0.9 % IV BOLUS: 500.0000 mL | Freq: Once | INTRAVENOUS | Status: DC

## 2019-09-24 MED ORDER — IOHEXOL 350 MG/ML SOLN
100.0000 mL | Freq: Once | INTRAVENOUS | Status: AC | PRN
  Administered 2019-09-24: 100 mL via INTRAVENOUS

## 2019-09-24 MED ORDER — ALBUTEROL SULFATE HFA 108 (90 BASE) MCG/ACT IN AERS: 2.0000 | INHALATION_SPRAY | RESPIRATORY_TRACT | 0 refills | Status: AC | PRN

## 2019-09-24 NOTE — ED Provider Notes (Signed)
[facility] Emergency Department Provider Note   ____________________________________________   First MD Initiated Contact with Patient 09/24/19 [phone]     (approximate)  I have reviewed the triage vital signs and the nursing notes.   HISTORY  Chief Complaint Shortness of Breath    HPI Nicholas Cabrera is a 53 y.o. male who presents to the ED from home with a chief complaint of shortness of breath.  Patient was sleeping with his CPAP when he was awakened feeling short of breath.  Thinks he took a deep breath which woke him up.  Reports difficulty with deep inspiration.  Denies fever, cough, chest pain, abdominal pain, nausea or vomiting.       Past Medical History:  Diagnosis Date  . Asthma   . Depression   . Diabetes ([place])   . Hx of diverticulitis of colon   . Hypertension   . Hypothyroidism   . Hypothyroidism   . Low testosterone   . Obesity   . Obesity   . OSA (obstructive sleep apnea)   . Testicular hypofunction   . Vitamin D deficiency     Patient Active Problem List   Diagnosis Date Noted  . Hypomagnesemia 05/21/2016  . Fatigue 05/21/2016  . Hypogonadism in male 05/21/2016  . Vitamin D deficiency 05/21/2016  . Erectile dysfunction 05/21/2016  . Poor circulation of extremity 04/24/2016  . Diverticulitis 08/12/2015  . Obstructive sleep apnea 07/01/2015  . Type 2 diabetes mellitus, uncontrolled ([place])   . Hypertension   . Hyperlipidemia   . Hypothyroidism   . Obesity   . Depression     Past Surgical History:  Procedure Laterality Date  . CLOSED REDUCTION FINGER WITH PERCUTANEOUS PINNING Left 10/23/2017   Procedure: CLOSED REDUCTION FINGER WITH PERCUTANEOUS PINNING-LEFT FIFTH;  Surgeon: Hessie Knows, MD;  Location: [facility] ORS;  Service: Orthopedics;  Laterality: Left;  . COLONOSCOPY    . POLYPECTOMY    . TRIGGER FINGER RELEASE Left 03/19/2018   Procedure: RELEASE TRIGGER FINGER/A-1 PULLEY-LEFT LITTLE FINGER;  Surgeon: Hessie Knows, MD;  Location: [facility] ORS;  Service: Orthopedics;  Laterality: Left;  . ULNAR TUNNEL RELEASE Left 03/19/2018   Procedure: CUBITAL TUNNEL RELEASE;  Surgeon: Hessie Knows, MD;  Location: [facility] ORS;  Service: Orthopedics;  Laterality: Left;    Prior to Admission medications   Medication Sig Start Date End Date Taking? Authorizing Provider  albuterol (VENTOLIN HFA) 108 (90 Base) MCG/ACT inhaler Inhale 2 puffs into the lungs every 4 (four) hours as needed for shortness of breath. 09/24/19   Paulette Blanch, MD  amLODipine (NORVASC) 5 MG tablet Take 5 mg by mouth daily.    [provider]  aspirin 81 MG tablet Take 81 mg by mouth daily.    [provider]  chlorthalidone (HYGROTON) 25 MG tablet Take 1 tablet (25 mg total) by mouth daily. 07/30/16   Lada, Satira Anis, MD  escitalopram (LEXAPRO) 10 MG tablet Take 10 mg by mouth every evening.    [provider]  fluticasone (FLONASE) 50 MCG/ACT nasal spray Place 2 sprays into both nostrils daily. Patient taking differently: Place 2 sprays into both nostrils at bedtime as needed for allergies.  07/25/16   Arnetha Courser, MD  levothyroxine (SYNTHROID, LEVOTHROID) 75 MCG tablet Take 1 tablet (75 mcg total) by mouth daily before breakfast. 07/30/16   Lada, Satira Anis, MD  metoprolol succinate (TOPROL-XL) 50 MG 24 hr tablet Take 1 tablet (50 mg total) by mouth daily. Take with  or immediately following a meal. 07/30/16   Lada, Satira Anis, MD  olmesartan (BENICAR) 40 MG tablet Take 40 mg by mouth daily.    [provider]  oxyCODONE (ROXICODONE) 5 MG immediate release tablet Take 1 tablet (5 mg total) by mouth every 4 (four) hours as needed for severe pain. 03/19/18   Hessie Knows, MD  XIGDUO XR 04-999 MG TB24 Take 1 tablet by mouth 2 (two) times daily. 09/19/17   [provider]    Allergies Tussionex pennkinetic er [hydrocod polst-cpm polst er]  Family History  Problem Relation Age of Onset  . Hypertension Mother   .  Stroke Father   . Hypertension Father   . Diabetes Father   . Glaucoma Father   . Heart disease Maternal Grandfather   . Stroke Paternal Grandmother   . Diabetes Paternal Grandmother   . Diabetes Paternal Grandfather   . Lung disease Paternal Grandfather   . Cancer Maternal Uncle        lung    Social History Social History   Tobacco Use  . Smoking status: Former Smoker    Quit date: 05/17/2006    Years since quitting: 13.3  . Smokeless tobacco: Never Used  Substance Use Topics  . Alcohol use: No  . Drug use: No    Review of Systems  Constitutional: No fever/chills Eyes: No visual changes. ENT: No sore throat. Cardiovascular: Denies chest pain. Respiratory: Positive for shortness of breath. Gastrointestinal: No abdominal pain.  No nausea, no vomiting.  No diarrhea.  No constipation. Genitourinary: Negative for dysuria. Musculoskeletal: Negative for back pain. Skin: Negative for rash. Neurological: Negative for headaches, focal weakness or numbness.   ____________________________________________   PHYSICAL EXAM:  VITAL SIGNS: ED Triage Vitals  Enc Vitals Group     BP 09/24/19 0149 (!) 144/79     Pulse Rate 09/24/19 0149 73     Resp 09/24/19 0149 19     Temp 09/24/19 0149 97.8 F (36.6 C)     Temp Source 09/24/19 0149 Oral     SpO2 09/24/19 0149 97 %     Weight 09/24/19 0148 270 lb (122.5 kg)     Height 09/24/19 0148 5\' 9"  (1.753 m)     Head Circumference --      Peak Flow --      Pain Score --      Pain Loc --      Pain Edu? --      Excl. in [place]? --     Constitutional: Alert and oriented. Well appearing and in no acute distress. Eyes: Conjunctivae are normal. PERRL. EOMI. Head: Atraumatic. Nose: No congestion/rhinnorhea. Mouth/Throat: Mucous membranes are moist.  Oropharynx non-erythematous. Neck: No stridor.   Cardiovascular: Normal rate, regular rhythm. Grossly normal heart sounds.  Good peripheral circulation. Respiratory: Normal respiratory  effort.  No retractions. Lungs CTAB.  No wheezing or rhonchi. Gastrointestinal: Soft and nontender. No distention. No abdominal bruits. No CVA tenderness. Musculoskeletal: No lower extremity tenderness nor edema.  No joint effusions. Neurologic:  Normal speech and language. No gross focal neurologic deficits are appreciated. No gait instability. Skin:  Skin is warm, dry and intact. No rash noted. Psychiatric: Mood and affect are normal. Speech and behavior are normal.  ____________________________________________   LABS (all labs ordered are listed, but only abnormal results are displayed)  Labs Reviewed  BASIC METABOLIC PANEL - Abnormal; Notable for the following components:      Result Value   Chloride 97 (*)  Glucose, Bld 123 (*)    All other components within normal limits  CBC - Abnormal; Notable for the following components:   RBC 6.12 (*)    Hemoglobin 17.5 (*)    HCT 52.3 (*)    All other components within normal limits  TROPONIN I (HIGH SENSITIVITY)  TROPONIN I (HIGH SENSITIVITY)   ____________________________________________  EKG  ED ECG REPORT I, Leam Madero J, the attending physician, personally viewed and interpreted this ECG.   Date: 09/24/2019  EKG Time: 0148  Rate: 72  Rhythm: normal EKG, normal sinus rhythm  Axis: LAD  Intervals:none  ST&T Change: Nonspecific  ____________________________________________  RADIOLOGY  ED MD interpretation: No acute cardiopulmonary process; no PE  Official radiology report(s): Ct Angio Chest Pe W/cm &/or Wo Cm  Result Date: 09/24/2019 CLINICAL DATA:  53 year old male with shortness of breath. EXAM: CT ANGIOGRAPHY CHEST WITH CONTRAST TECHNIQUE: Multidetector CT imaging of the chest was performed using the standard protocol during bolus administration of intravenous contrast. Multiplanar CT image reconstructions and MIPs were obtained to evaluate the vascular anatomy. CONTRAST:  176mL OMNIPAQUE IOHEXOL 350 MG/ML SOLN  COMPARISON:  Chest radiograph dated 09/24/2019. FINDINGS: Cardiovascular: There is no cardiomegaly or pericardial effusion. The thoracic aorta is unremarkable. Evaluation of the pulmonary arteries is somewhat limited due to suboptimal opacification of the peripheral branches. No definite pulmonary artery embolus identified. Mediastinum/Nodes: There is no hilar or mediastinal adenopathy. The esophagus and the thyroid gland are grossly unremarkable. No mediastinal fluid collection. Lungs/Pleura: The lungs are clear. There is no pleural effusion or pneumothorax. The central airways are patent. Upper Abdomen: Diffuse fatty infiltration of the liver. The visualized upper abdomen is otherwise unremarkable. Musculoskeletal: No chest wall abnormality. No acute or significant osseous findings. Review of the MIP images confirms the above findings. IMPRESSION: 1. No acute intrathoracic pathology. No CT evidence of pulmonary embolism. 2. Fatty liver. Electronically Signed   By: Anner Crete M.D.   On: 09/24/2019 03:57   Dg Chest Port 1 View  Result Date: 09/24/2019 CLINICAL DATA:  53 year old male with shortness of breath EXAM: PORTABLE CHEST 1 VIEW COMPARISON:  Chest radiograph dated 08/14/2013 FINDINGS: Shallow inspiration. No focal consolidation, pleural effusion, or pneumothorax. Top-normal cardiac silhouette. No acute osseous pathology. IMPRESSION: No active disease. Electronically Signed   By: Anner Crete M.D.   On: 09/24/2019 03:27    ____________________________________________   PROCEDURES  Procedure(s) performed (including Critical Care):  Procedures   ____________________________________________   INITIAL IMPRESSION / ASSESSMENT AND PLAN / ED COURSE  As part of my medical decision making, I reviewed the following data within the [place] notes reviewed and incorporated, Labs reviewed, EKG interpreted, Old chart reviewed, Radiograph reviewed and Notes from  prior ED visits     Nicholas Cabrera was evaluated in Emergency Department on 09/24/2019 for the symptoms described in the history of present illness. He was evaluated in the context of the global COVID-19 pandemic, which necessitated consideration that the patient might be at risk for infection with the SARS-CoV-2 virus that causes COVID-19. Institutional protocols and algorithms that pertain to the evaluation of patients at risk for COVID-19 are in a state of rapid change based on information released by regulatory bodies including the CDC and federal and state organizations. These policies and algorithms were followed during the patient's care in the ED.    53 year old male who presents with shortness of breath. Differential includes, but is not limited to, viral syndrome, bronchitis including COPD exacerbation, pneumonia,  reactive airway disease including asthma, CHF including exacerbation with or without pulmonary/interstitial edema, pneumothorax, ACS, thoracic trauma, and pulmonary embolism.  Patient appears hemoconcentrated on lab work.  Will administer IV fluids, obtain CTA chest to evaluate for PE as he is on hormone therapy.  Currently states he is feeling significantly better and back to baseline.  Clinical Course as of Sep 23 648  Fri Sep 24, 2019  0411 Updated patient on CT imaging result.  Will discharge home with albuterol inhaler to use as needed.  Strict return precautions given.  Patient verbalizes understanding agrees with plan of care.   [JS]    Clinical Course User Index [JS] Paulette Blanch, MD     ____________________________________________   FINAL CLINICAL IMPRESSION(S) / ED DIAGNOSES  Final diagnoses:  SOB (shortness of breath)     ED Discharge Orders         Ordered    albuterol (VENTOLIN HFA) 108 (90 Base) MCG/ACT inhaler  Every 4 hours PRN     09/24/19 0412           Note:  This document was prepared using Dragon voice recognition software and may  include unintentional dictation errors.   Paulette Blanch, MD 09/24/19 [phone]

## 2019-09-24 NOTE — ED Triage Notes (Addendum)
Patient c/o SOB. Patient denies other symptoms. Patient reports difficulty with deep inspiration. Patient's respirations even and unlabored. Patient's lung sounds clear all fields.

## 2019-09-24 NOTE — Discharge Instructions (Addendum)
You may use Albuterol inhaler 2 puffs every 4 hours as needed for difficulty breathing.  Return to the ER for worsening symptoms, persistent vomiting, difficulty breathing or other concerns.

## 2019-10-08 ENCOUNTER — Other Ambulatory Visit: Admission: RE | Admit: 2019-10-08 | Payer: BC Managed Care – PPO | Source: Ambulatory Visit

## 2019-10-11 ENCOUNTER — Other Ambulatory Visit: Payer: Self-pay

## 2019-10-11 ENCOUNTER — Other Ambulatory Visit
Admission: RE | Admit: 2019-10-11 | Discharge: 2019-10-11 | Disposition: A | Discharge status: Home / Self Care | Payer: BC Managed Care – PPO | Source: Ambulatory Visit | Attending: Internal Medicine | Admitting: Internal Medicine

## 2019-10-11 DIAGNOSIS — Z01812 Encounter for preprocedural laboratory examination: Secondary | ICD-10-CM | POA: Diagnosis not present

## 2019-10-11 DIAGNOSIS — Z20828 Contact with and (suspected) exposure to other viral communicable diseases: Secondary | ICD-10-CM | POA: Insufficient documentation

## 2019-10-12 LAB — SARS CORONAVIRUS 2 (TAT 6-24 HRS): SARS Coronavirus 2: NEGATIVE

## 2019-10-13 ENCOUNTER — Ambulatory Visit: Payer: BC Managed Care – PPO | Attending: Internal Medicine

## 2019-10-13 DIAGNOSIS — G4733 Obstructive sleep apnea (adult) (pediatric): Secondary | ICD-10-CM | POA: Insufficient documentation

## 2019-10-14 ENCOUNTER — Other Ambulatory Visit: Payer: Self-pay

## 2019-11-30 ENCOUNTER — Other Ambulatory Visit: Payer: Self-pay

## 2019-11-30 DIAGNOSIS — Z20822 Contact with and (suspected) exposure to covid-19: Secondary | ICD-10-CM

## 2019-12-02 ENCOUNTER — Encounter: Payer: Self-pay | Admitting: *Deleted

## 2019-12-02 LAB — NOVEL CORONAVIRUS, NAA: SARS-CoV-2, NAA: DETECTED — AB

## 2019-12-03 ENCOUNTER — Telehealth: Payer: Self-pay | Admitting: Unknown Physician Specialty

## 2019-12-03 NOTE — Telephone Encounter (Signed)
Called about monoclonal antibody infusion.  Pt does not qualify as he is asymptomatic

## 2020-02-28 ENCOUNTER — Other Ambulatory Visit: Payer: Self-pay

## 2020-02-28 ENCOUNTER — Emergency Department
Admission: EM | Admit: 2020-02-28 | Discharge: 2020-02-28 | Disposition: A | Discharge status: Home / Self Care | Payer: BC Managed Care – PPO | Attending: Emergency Medicine | Admitting: Emergency Medicine

## 2020-02-28 ENCOUNTER — Emergency Department: Payer: BC Managed Care – PPO

## 2020-02-28 DIAGNOSIS — I10 Essential (primary) hypertension: Secondary | ICD-10-CM | POA: Diagnosis not present

## 2020-02-28 DIAGNOSIS — E119 Type 2 diabetes mellitus without complications: Secondary | ICD-10-CM | POA: Diagnosis not present

## 2020-02-28 DIAGNOSIS — H6993 Unspecified Eustachian tube disorder, bilateral: Secondary | ICD-10-CM | POA: Insufficient documentation

## 2020-02-28 DIAGNOSIS — Z79899 Other long term (current) drug therapy: Secondary | ICD-10-CM | POA: Insufficient documentation

## 2020-02-28 DIAGNOSIS — J45909 Unspecified asthma, uncomplicated: Secondary | ICD-10-CM | POA: Insufficient documentation

## 2020-02-28 DIAGNOSIS — Z7982 Long term (current) use of aspirin: Secondary | ICD-10-CM | POA: Diagnosis not present

## 2020-02-28 DIAGNOSIS — E876 Hypokalemia: Secondary | ICD-10-CM | POA: Diagnosis not present

## 2020-02-28 DIAGNOSIS — E039 Hypothyroidism, unspecified: Secondary | ICD-10-CM | POA: Diagnosis not present

## 2020-02-28 DIAGNOSIS — H6983 Other specified disorders of Eustachian tube, bilateral: Secondary | ICD-10-CM

## 2020-02-28 DIAGNOSIS — Z87891 Personal history of nicotine dependence: Secondary | ICD-10-CM | POA: Insufficient documentation

## 2020-02-28 DIAGNOSIS — R0602 Shortness of breath: Secondary | ICD-10-CM | POA: Insufficient documentation

## 2020-02-28 LAB — BASIC METABOLIC PANEL
Anion gap: 9 (ref 5–15)
BUN: 17 mg/dL (ref 6–20)
CO2: 32 mmol/L (ref 22–32)
Calcium: 9.3 mg/dL (ref 8.9–10.3)
Chloride: 98 mmol/L (ref 98–111)
Creatinine, Ser: 0.99 mg/dL (ref 0.61–1.24)
GFR calc Af Amer: >60 mL/min (ref >60)
GFR calc non Af Amer: >60 mL/min (ref >60)
Glucose, Bld: 131 mg/dL — ABNORMAL HIGH (ref 70–99)
Potassium: 3 mmol/L — ABNORMAL LOW (ref 3.5–5.1)
Sodium: 139 mmol/L (ref 135–145)

## 2020-02-28 LAB — CBC WITH DIFFERENTIAL/PLATELET
Abs Immature Granulocytes: 0.09 10*3/uL — ABNORMAL HIGH (ref 0.00–0.07)
Basophils Absolute: 0.1 10*3/uL (ref 0.0–0.1)
Basophils Relative: 1 %
Eosinophils Absolute: 0.3 10*3/uL (ref 0.0–0.5)
Eosinophils Relative: 3 %
HCT: 49.4 % (ref 39.0–52.0)
Hemoglobin: 16.6 g/dL (ref 13.0–17.0)
Immature Granulocytes: 1 %
Lymphocytes Relative: 25 %
Lymphs Abs: 2.8 10*3/uL (ref 0.7–4.0)
MCH: 29.1 pg (ref 26.0–34.0)
MCHC: 33.6 g/dL (ref 30.0–36.0)
MCV: 86.7 fL (ref 80.0–100.0)
Monocytes Absolute: 0.8 10*3/uL (ref 0.1–1.0)
Monocytes Relative: 7 %
Neutro Abs: 7.4 10*3/uL (ref 1.7–7.7)
Neutrophils Relative %: 63 %
Platelets: 246 10*3/uL (ref 150–400)
RBC: 5.7 MIL/uL (ref 4.22–5.81)
RDW: 13.5 % (ref 11.5–15.5)
WBC: 11.4 10*3/uL — ABNORMAL HIGH (ref 4.0–10.5)
nRBC: 0 % (ref 0.0–0.2)

## 2020-02-28 LAB — BRAIN NATRIURETIC PEPTIDE: B Natriuretic Peptide: 26 pg/mL (ref 0.0–100.0)

## 2020-02-28 LAB — TROPONIN I (HIGH SENSITIVITY): Troponin I (High Sensitivity): 4 ng/L (ref <18)

## 2020-02-28 MED ORDER — COMPRESSOR/NEBULIZER MISC: 1.0000 [IU] | 0 refills | Status: AC | PRN

## 2020-02-28 MED ORDER — IPRATROPIUM-ALBUTEROL 0.5-2.5 (3) MG/3ML IN SOLN
3.0000 mL | Freq: Once | RESPIRATORY_TRACT | Status: AC
Start: 2020-02-28 — End: 2020-02-28
  Administered 2020-02-28: 04:00:00 3 mL via RESPIRATORY_TRACT
  Filled 2020-02-28: qty 3

## 2020-02-28 MED ORDER — POTASSIUM CHLORIDE CRYS ER 20 MEQ PO TBCR
40.0000 meq | EXTENDED_RELEASE_TABLET | Freq: Once | ORAL | Status: AC
  Administered 2020-02-28: 06:00:00 40 meq via ORAL
  Filled 2020-02-28: qty 2

## 2020-02-28 MED ORDER — PREDNISONE 20 MG PO TABS: ORAL_TABLET | ORAL | 0 refills | Status: DC

## 2020-02-28 MED ORDER — PREDNISONE 20 MG PO TABS
60.0000 mg | ORAL_TABLET | Freq: Once | ORAL | Status: AC
  Administered 2020-02-28: 60 mg via ORAL
  Filled 2020-02-28: qty 3

## 2020-02-28 MED ORDER — ALBUTEROL SULFATE (2.5 MG/3ML) 0.083% IN NEBU: 2.5000 mg | INHALATION_SOLUTION | RESPIRATORY_TRACT | 0 refills | Status: AC | PRN

## 2020-02-28 NOTE — ED Triage Notes (Signed)
Patient reports feeling very congested and short of breath.  Reports he can't get relief even with his c-pap at night.  Patient is speaking in complete sentences without difficulty during triage.

## 2020-02-28 NOTE — ED Provider Notes (Signed)
[facility] Emergency Department Provider Note   ____________________________________________   First MD Initiated Contact with Patient 02/28/20 [phone]     (approximate)  I have reviewed the triage vital signs and the nursing notes.   HISTORY  Chief Complaint Shortness of Breath    HPI Nicholas Cabrera is a 54 y.o. male who presents to the ED from home with a chief complaint of shortness of breath.  Patient reports feeling very congested and had 2 episodes of being short of breath while wearing his CPAP tonight.  Patient had COVID-19 back in December.  Was prescribed an albuterol inhaler which he ran out of and his PCP refilled yesterday.  Had 4 puffs of his albuterol inhaler in a 2-hour span prior to arrival which has partially relieved him symptoms.  Patient was not hospitalized for COVID-19.  States he has been having intermittent breathing problems since then.  Also complaining of both ears being stopped up since his diagnosis.  Denies fever, cough, chest pain, abdominal pain, nausea, vomiting, palpitations or dizziness.  Denies recent travel or trauma.  Takes testosterone.       Past Medical History:  Diagnosis Date   Asthma    Depression    Diabetes ([place])    Hx of diverticulitis of colon    Hypertension    Hypothyroidism    Hypothyroidism    Low testosterone    Obesity    Obesity    OSA (obstructive sleep apnea)    Testicular hypofunction    Vitamin D deficiency     Patient Active Problem List   Diagnosis Date Noted   Hypomagnesemia 05/21/2016   Fatigue 05/21/2016   Hypogonadism in male 05/21/2016   Vitamin D deficiency 05/21/2016   Erectile dysfunction 05/21/2016   Poor circulation of extremity 04/24/2016   Diverticulitis 08/12/2015   Obstructive sleep apnea 07/01/2015   Type 2 diabetes mellitus, uncontrolled ([place])    Hypertension    Hyperlipidemia    Hypothyroidism    Obesity    Depression     Past  Surgical History:  Procedure Laterality Date   CLOSED REDUCTION FINGER WITH PERCUTANEOUS PINNING Left 10/23/2017   Procedure: CLOSED REDUCTION FINGER WITH PERCUTANEOUS PINNING-LEFT FIFTH;  Surgeon: Hessie Knows, MD;  Location: [facility] ORS;  Service: Orthopedics;  Laterality: Left;   COLONOSCOPY     POLYPECTOMY     TRIGGER FINGER RELEASE Left 03/19/2018   Procedure: RELEASE TRIGGER FINGER/A-1 PULLEY-LEFT LITTLE FINGER;  Surgeon: Hessie Knows, MD;  Location: [facility] ORS;  Service: Orthopedics;  Laterality: Left;   ULNAR TUNNEL RELEASE Left 03/19/2018   Procedure: CUBITAL TUNNEL RELEASE;  Surgeon: Hessie Knows, MD;  Location: [facility] ORS;  Service: Orthopedics;  Laterality: Left;    Prior to Admission medications   Medication Sig Start Date End Date Taking? Authorizing Provider  albuterol (PROVENTIL) (2.5 MG/3ML) 0.083% nebulizer solution Take 3 mLs (2.5 mg total) by nebulization every 4 (four) hours as needed for wheezing or shortness of breath. 02/28/20   Paulette Blanch, MD  albuterol (VENTOLIN HFA) 108 (90 Base) MCG/ACT inhaler Inhale 2 puffs into the lungs every 4 (four) hours as needed for shortness of breath. 09/24/19   Paulette Blanch, MD  amLODipine (NORVASC) 5 MG tablet Take 5 mg by mouth daily.    [provider]  aspirin 81 MG tablet Take 81 mg by mouth daily.    [provider]  chlorthalidone (HYGROTON) 25 MG tablet Take 1 tablet (25 mg total) by mouth daily.  07/30/16   Lada, Satira Anis, MD  escitalopram (LEXAPRO) 10 MG tablet Take 10 mg by mouth every evening.    [provider]  fluticasone (FLONASE) 50 MCG/ACT nasal spray Place 2 sprays into both nostrils daily. Patient taking differently: Place 2 sprays into both nostrils at bedtime as needed for allergies.  07/25/16   Arnetha Courser, MD  levothyroxine (SYNTHROID, LEVOTHROID) 75 MCG tablet Take 1 tablet (75 mcg total) by mouth daily before breakfast. 07/30/16   Lada, Satira Anis, MD  metoprolol succinate (TOPROL-XL) 50  MG 24 hr tablet Take 1 tablet (50 mg total) by mouth daily. Take with or immediately following a meal. 07/30/16   Lada, Satira Anis, MD  Nebulizers (COMPRESSOR/NEBULIZER) MISC 1 Units by Does not apply route every 4 (four) hours as needed. 02/28/20   Paulette Blanch, MD  olmesartan (BENICAR) 40 MG tablet Take 40 mg by mouth daily.    [provider]  oxyCODONE (ROXICODONE) 5 MG immediate release tablet Take 1 tablet (5 mg total) by mouth every 4 (four) hours as needed for severe pain. 03/19/18   Hessie Knows, MD  predniSONE (DELTASONE) 20 MG tablet 3 tablets daily x 4 days 02/28/20   Paulette Blanch, MD  XIGDUO XR 04-999 MG TB24 Take 1 tablet by mouth 2 (two) times daily. 09/19/17   [provider]    Allergies Tussionex pennkinetic er [hydrocod polst-cpm polst er]  Family History  Problem Relation Age of Onset   Hypertension Mother    Stroke Father    Hypertension Father    Diabetes Father    Glaucoma Father    Heart disease Maternal Grandfather    Stroke Paternal Grandmother    Diabetes Paternal Grandmother    Diabetes Paternal Grandfather    Lung disease Paternal Grandfather    Cancer Maternal Uncle        lung    Social History Social History   Tobacco Use   Smoking status: Former Smoker    Quit date: 05/17/2006    Years since quitting: 13.7   Smokeless tobacco: Never Used  Substance Use Topics   Alcohol use: No   Drug use: No    Review of Systems  Constitutional: No fever/chills Eyes: No visual changes. ENT: Positive for congestion. Cardiovascular: Denies chest pain. Respiratory: Positive for shortness of breath. Gastrointestinal: No abdominal pain.  No nausea, no vomiting.  No diarrhea.  No constipation. Genitourinary: Negative for dysuria. Musculoskeletal: Negative for back pain. Skin: Negative for rash. Neurological: Negative for headaches, focal weakness or numbness.   ____________________________________________   PHYSICAL  EXAM:  VITAL SIGNS: ED Triage Vitals  Enc Vitals Group     BP 02/28/20 0238 (!) 179/99     Pulse Rate 02/28/20 0238 73     Resp 02/28/20 0238 18     Temp 02/28/20 0238 97.9 F (36.6 C)     Temp Source 02/28/20 0238 Oral     SpO2 02/28/20 0238 97 %     Weight 02/28/20 0239 269 lb (122 kg)     Height 02/28/20 0239 5\' 9"  (1.753 m)     Head Circumference --      Peak Flow --      Pain Score 02/28/20 0238 0     Pain Loc --      Pain Edu? --      Excl. in [place]? --     Constitutional: Alert and oriented. Well appearing and in no acute distress. Eyes: Conjunctivae are  normal. PERRL. EOMI. Head: Atraumatic. Ears: Mild fluid behind both TMs; otherwise unremarkable. Nose: No congestion/rhinnorhea. Mouth/Throat: Mucous membranes are moist.   Neck: No stridor.   Cardiovascular: Normal rate, regular rhythm. Grossly normal heart sounds.  Good peripheral circulation. Respiratory: Normal respiratory effort.  No retractions. Lungs slightly diminished bibasilarly. Gastrointestinal: Soft and nontender. No distention. No abdominal bruits. No CVA tenderness. Musculoskeletal: No lower extremity tenderness nor edema.  No joint effusions. Neurologic:  Normal speech and language. No gross focal neurologic deficits are appreciated. No gait instability. Skin:  Skin is warm, dry and intact. No rash noted. Psychiatric: Mood and affect are normal. Speech and behavior are normal.  ____________________________________________   LABS (all labs ordered are listed, but only abnormal results are displayed)  Labs Reviewed  CBC WITH DIFFERENTIAL/PLATELET - Abnormal; Notable for the following components:      Result Value   WBC 11.4 (*)    Abs Immature Granulocytes 0.09 (*)    All other components within normal limits  BASIC METABOLIC PANEL - Abnormal; Notable for the following components:   Potassium 3.0 (*)    Glucose, Bld 131 (*)    All other components within normal limits  BRAIN NATRIURETIC PEPTIDE   TROPONIN I (HIGH SENSITIVITY)   ____________________________________________  EKG  ED ECG REPORT I, Lijah Bourque J, the attending physician, personally viewed and interpreted this ECG.   Date: 02/28/2020  EKG Time: 0243  Rate: 75  Rhythm: normal EKG, normal sinus rhythm  Axis: Normal  Intervals:none  ST&T Change: Nonspecific  ____________________________________________  RADIOLOGY  ED MD interpretation: No acute cardiopulmonary process  Official radiology report(s): DG Chest 2 View  Result Date: 02/28/2020 CLINICAL DATA:  Short of breath.  Chest congestion. EXAM: CHEST - 2 VIEW COMPARISON:  09/24/2019 FINDINGS: Heart size is normal. Mediastinal shadows are normal. The lungs are clear. No bronchial thickening. No infiltrate, mass, effusion or collapse. Pulmonary vascularity is normal. No bony abnormality. IMPRESSION: Normal chest Electronically Signed   By: Nelson Chimes M.D.   On: 02/28/2020 03:05    ____________________________________________   PROCEDURES  Procedure(s) performed (including Critical Care):  Procedures   ____________________________________________   INITIAL IMPRESSION / ASSESSMENT AND PLAN / ED COURSE  As part of my medical decision making, I reviewed the following data within the [place] notes reviewed and incorporated, Labs reviewed, EKG interpreted, Old chart reviewed, Radiograph reviewed and Notes from prior ED visits     Nicholas Cabrera was evaluated in Emergency Department on 02/28/2020 for the symptoms described in the history of present illness. He was evaluated in the context of the global COVID-19 pandemic, which necessitated consideration that the patient might be at risk for infection with the SARS-CoV-2 virus that causes COVID-19. Institutional protocols and algorithms that pertain to the evaluation of patients at risk for COVID-19 are in a state of rapid change based on information released by regulatory bodies  including the CDC and federal and state organizations. These policies and algorithms were followed during the patient's care in the ED.    54 year old male status post COVID-19 in December 2020 who presents with shortness of breath. Differential includes, but is not limited to, viral syndrome, bronchitis including COPD exacerbation, pneumonia, reactive airway disease including asthma, CHF including exacerbation with or without pulmonary/interstitial edema, pneumothorax, ACS, thoracic trauma, and pulmonary embolism.  Patient is well-appearing in no acute distress.  Desires breathing treatment.  Will administer DuoNeb, start Prednisone.  Awaiting lab work.  Will reassess.  Low  clinical suspicion for ACS or PE.   Clinical Course as of Feb 27 601  Mon Feb 28, 2020  0532 Patient improved after nebulizer treatment.  Will discharge home with prescription for prednisone, nebulizer machine, albuterol solution and patient will follow up closely with his PCP.  Strict return precautions given.  Patient verbalizes understanding agrees with plan of care.   [JS]    Clinical Course User Index [JS] Paulette Blanch, MD     ____________________________________________   FINAL CLINICAL IMPRESSION(S) / ED DIAGNOSES  Final diagnoses:  Shortness of breath  Eustachian tube dysfunction, bilateral  Hypokalemia     ED Discharge Orders         Ordered    predniSONE (DELTASONE) 20 MG tablet     02/28/20 0541    albuterol (PROVENTIL) (2.5 MG/3ML) 0.083% nebulizer solution  Every 4 hours PRN     02/28/20 0541    Nebulizers (COMPRESSOR/NEBULIZER) MISC  Every 4 hours PRN     02/28/20 0541           Note:  This document was prepared using Dragon voice recognition software and may include unintentional dictation errors.   Paulette Blanch, MD 02/28/20 [phone]

## 2020-02-28 NOTE — Discharge Instructions (Addendum)
1.  Finish Prednisone 60 mg daily x4 days.  Start your next dose Tuesday morning. 2.  You may use Albuterol nebulizer every 4 hours as needed for difficulty breathing. 3.  Return to the ER for worsening symptoms, persistent vomiting, difficulty breathing or other concerns.

## 2020-03-12 ENCOUNTER — Encounter: Payer: Self-pay | Admitting: Emergency Medicine

## 2020-03-12 ENCOUNTER — Emergency Department
Admission: EM | Admit: 2020-03-12 | Discharge: 2020-03-12 | Disposition: A | Discharge status: Home / Self Care | Payer: BC Managed Care – PPO | Attending: Student | Admitting: Student

## 2020-03-12 ENCOUNTER — Other Ambulatory Visit: Payer: Self-pay

## 2020-03-12 ENCOUNTER — Emergency Department: Payer: BC Managed Care – PPO

## 2020-03-12 DIAGNOSIS — K5730 Diverticulosis of large intestine without perforation or abscess without bleeding: Secondary | ICD-10-CM | POA: Insufficient documentation

## 2020-03-12 DIAGNOSIS — E039 Hypothyroidism, unspecified: Secondary | ICD-10-CM | POA: Insufficient documentation

## 2020-03-12 DIAGNOSIS — E119 Type 2 diabetes mellitus without complications: Secondary | ICD-10-CM | POA: Insufficient documentation

## 2020-03-12 DIAGNOSIS — I10 Essential (primary) hypertension: Secondary | ICD-10-CM | POA: Diagnosis not present

## 2020-03-12 DIAGNOSIS — Z87891 Personal history of nicotine dependence: Secondary | ICD-10-CM | POA: Diagnosis not present

## 2020-03-12 DIAGNOSIS — K5792 Diverticulitis of intestine, part unspecified, without perforation or abscess without bleeding: Secondary | ICD-10-CM

## 2020-03-12 DIAGNOSIS — Z79899 Other long term (current) drug therapy: Secondary | ICD-10-CM | POA: Insufficient documentation

## 2020-03-12 DIAGNOSIS — Z7982 Long term (current) use of aspirin: Secondary | ICD-10-CM | POA: Diagnosis not present

## 2020-03-12 DIAGNOSIS — R1032 Left lower quadrant pain: Secondary | ICD-10-CM | POA: Diagnosis present

## 2020-03-12 LAB — CBC
HCT: 50.4 % (ref 39.0–52.0)
Hemoglobin: 17.2 g/dL — ABNORMAL HIGH (ref 13.0–17.0)
MCH: 29.7 pg (ref 26.0–34.0)
MCHC: 34.1 g/dL (ref 30.0–36.0)
MCV: 87 fL (ref 80.0–100.0)
Platelets: 245 10*3/uL (ref 150–400)
RBC: 5.79 MIL/uL (ref 4.22–5.81)
RDW: 13.5 % (ref 11.5–15.5)
WBC: 17.4 10*3/uL — ABNORMAL HIGH (ref 4.0–10.5)
nRBC: 0 % (ref 0.0–0.2)

## 2020-03-12 LAB — COMPREHENSIVE METABOLIC PANEL
ALT: 19 U/L (ref 0–44)
AST: 17 U/L (ref 15–41)
Albumin: 3.9 g/dL (ref 3.5–5.0)
Alkaline Phosphatase: 44 U/L (ref 38–126)
Anion gap: 11 (ref 5–15)
BUN: 11 mg/dL (ref 6–20)
CO2: 29 mmol/L (ref 22–32)
Calcium: 9.2 mg/dL (ref 8.9–10.3)
Chloride: 96 mmol/L — ABNORMAL LOW (ref 98–111)
Creatinine, Ser: 0.71 mg/dL (ref 0.61–1.24)
GFR calc Af Amer: >60 mL/min (ref >60)
GFR calc non Af Amer: >60 mL/min (ref >60)
Glucose, Bld: 151 mg/dL — ABNORMAL HIGH (ref 70–99)
Potassium: 3.1 mmol/L — ABNORMAL LOW (ref 3.5–5.1)
Sodium: 136 mmol/L (ref 135–145)
Total Bilirubin: 1.6 mg/dL — ABNORMAL HIGH (ref 0.3–1.2)
Total Protein: 7.6 g/dL (ref 6.5–8.1)

## 2020-03-12 LAB — LIPASE, BLOOD: Lipase: 30 U/L (ref 11–51)

## 2020-03-12 MED ORDER — MORPHINE SULFATE (PF) 4 MG/ML IV SOLN
4.0000 mg | Freq: Once | INTRAVENOUS | Status: AC
  Administered 2020-03-12: 4 mg via INTRAVENOUS
  Filled 2020-03-12: qty 1

## 2020-03-12 MED ORDER — ONDANSETRON 4 MG PO TBDP: 4.0000 mg | ORAL_TABLET | Freq: Three times a day (TID) | ORAL | 0 refills | Status: AC | PRN

## 2020-03-12 MED ORDER — FENTANYL CITRATE (PF) 100 MCG/2ML IJ SOLN
50.0000 ug | Freq: Once | INTRAMUSCULAR | Status: AC
  Administered 2020-03-12: 50 ug via INTRAVENOUS
  Filled 2020-03-12: qty 2

## 2020-03-12 MED ORDER — IOHEXOL 9 MG/ML PO SOLN: 500.0000 mL | Freq: Two times a day (BID) | ORAL | Status: DC | PRN

## 2020-03-12 MED ORDER — CIPROFLOXACIN HCL 500 MG PO TABS
500.0000 mg | ORAL_TABLET | Freq: Once | ORAL | Status: AC
  Administered 2020-03-12: 500 mg via ORAL
  Filled 2020-03-12: qty 1

## 2020-03-12 MED ORDER — DIPHENHYDRAMINE HCL 50 MG/ML IJ SOLN
12.5000 mg | Freq: Once | INTRAMUSCULAR | Status: AC
  Administered 2020-03-12: 12.5 mg via INTRAVENOUS
  Filled 2020-03-12: qty 1

## 2020-03-12 MED ORDER — METRONIDAZOLE 500 MG PO TABS: 500.0000 mg | ORAL_TABLET | Freq: Two times a day (BID) | ORAL | 0 refills | Status: AC

## 2020-03-12 MED ORDER — OXYCODONE-ACETAMINOPHEN 5-325 MG PO TABS: 1.0000 | ORAL_TABLET | Freq: Four times a day (QID) | ORAL | 0 refills | Status: AC | PRN

## 2020-03-12 MED ORDER — ONDANSETRON HCL 4 MG/2ML IJ SOLN
4.0000 mg | Freq: Once | INTRAMUSCULAR | Status: AC
  Administered 2020-03-12: 4 mg via INTRAVENOUS
  Filled 2020-03-12: qty 2

## 2020-03-12 MED ORDER — SODIUM CHLORIDE 0.9 % IV BOLUS
1000.0000 mL | Freq: Once | INTRAVENOUS | Status: AC
  Administered 2020-03-12: 1000 mL via INTRAVENOUS

## 2020-03-12 MED ORDER — SODIUM CHLORIDE 0.9% FLUSH
3.0000 mL | Freq: Once | INTRAVENOUS | Status: AC
  Administered 2020-03-12: 3 mL via INTRAVENOUS

## 2020-03-12 MED ORDER — OXYCODONE-ACETAMINOPHEN 5-325 MG PO TABS
1.0000 | ORAL_TABLET | Freq: Once | ORAL | Status: AC
  Administered 2020-03-12: 1 via ORAL
  Filled 2020-03-12: qty 1

## 2020-03-12 MED ORDER — CIPROFLOXACIN HCL 500 MG PO TABS: 500.0000 mg | ORAL_TABLET | Freq: Two times a day (BID) | ORAL | 0 refills | Status: AC

## 2020-03-12 MED ORDER — IOHEXOL 300 MG/ML  SOLN
125.0000 mL | Freq: Once | INTRAMUSCULAR | Status: AC | PRN
  Administered 2020-03-12: 125 mL via INTRAVENOUS

## 2020-03-12 MED ORDER — METRONIDAZOLE 500 MG PO TABS
500.0000 mg | ORAL_TABLET | Freq: Once | ORAL | Status: AC
  Administered 2020-03-12: 500 mg via ORAL
  Filled 2020-03-12: qty 1

## 2020-03-12 NOTE — ED Notes (Signed)
CT notified that pt is ready for CT scan

## 2020-03-12 NOTE — ED Notes (Signed)
Pt with c/o itching after pain meds, EDP notified, IV benadryl given Pt with c/o stomach pain while drinking contrast, pt given IV pain meds.

## 2020-03-12 NOTE — ED Notes (Signed)
Pt to CT

## 2020-03-12 NOTE — ED Triage Notes (Signed)
Pt to ED via POV c/o lower abd pain x 4 days. Pt states that he drank orange juice with pulp in it. Pt denies N/V/D or fever. Pt is tachycardic in triage. Otherwise in NAD.

## 2020-03-12 NOTE — ED Notes (Signed)
Pt wheeled to lobby

## 2020-03-12 NOTE — ED Provider Notes (Signed)
[facility] Emergency Department Provider Note  ____________________________________________  Time seen: Approximately 4:37 PM  I have reviewed the triage vital signs and the nursing notes.   HISTORY  Chief Complaint Abdominal Pain    HPI Nicholas Cabrera is a 54 y.o. male who presents the emergency department concerned that he may have another episode of diverticulitis.  Patient states that he drank orange juice with some pulp 2 days prior to the onset of left lower quadrant pain.  Patient states that the symptoms are consistent with his previous history of diverticulitis.  He states that he is typically very very cautious with his dietary intake trying to prevent episodes.  He denies any fever chills, URI symptoms, chest pain, shortness of breath.  Patient felt nauseated yesterday but had no emesis.  Symptoms began with diarrhea but patient states that he is constipated at this time.  No hematic emesis.  No hematochezia.  Patient does have a history of asthma, diabetes, hypertension, hypothyroidism, sleep apnea, diverticulitis.         Past Medical History:  Diagnosis Date  . Asthma   . Depression   . Diabetes ([place])   . Hx of diverticulitis of colon   . Hypertension   . Hypothyroidism   . Hypothyroidism   . Low testosterone   . Obesity   . Obesity   . OSA (obstructive sleep apnea)   . Testicular hypofunction   . Vitamin D deficiency     Patient Active Problem List   Diagnosis Date Noted  . Hypomagnesemia 05/21/2016  . Fatigue 05/21/2016  . Hypogonadism in male 05/21/2016  . Vitamin D deficiency 05/21/2016  . Erectile dysfunction 05/21/2016  . Poor circulation of extremity 04/24/2016  . Diverticulitis 08/12/2015  . Obstructive sleep apnea 07/01/2015  . Type 2 diabetes mellitus, uncontrolled ([place])   . Hypertension   . Hyperlipidemia   . Hypothyroidism   . Obesity   . Depression     Past Surgical History:  Procedure Laterality Date  .  CLOSED REDUCTION FINGER WITH PERCUTANEOUS PINNING Left 10/23/2017   Procedure: CLOSED REDUCTION FINGER WITH PERCUTANEOUS PINNING-LEFT FIFTH;  Surgeon: Hessie Knows, MD;  Location: [facility] ORS;  Service: Orthopedics;  Laterality: Left;  . COLONOSCOPY    . POLYPECTOMY    . TRIGGER FINGER RELEASE Left 03/19/2018   Procedure: RELEASE TRIGGER FINGER/A-1 PULLEY-LEFT LITTLE FINGER;  Surgeon: Hessie Knows, MD;  Location: [facility] ORS;  Service: Orthopedics;  Laterality: Left;  . ULNAR TUNNEL RELEASE Left 03/19/2018   Procedure: CUBITAL TUNNEL RELEASE;  Surgeon: Hessie Knows, MD;  Location: [facility] ORS;  Service: Orthopedics;  Laterality: Left;    Prior to Admission medications   Medication Sig Start Date End Date Taking? Authorizing Provider  albuterol (PROVENTIL) (2.5 MG/3ML) 0.083% nebulizer solution Take 3 mLs (2.5 mg total) by nebulization every 4 (four) hours as needed for wheezing or shortness of breath. 02/28/20   Paulette Blanch, MD  albuterol (VENTOLIN HFA) 108 (90 Base) MCG/ACT inhaler Inhale 2 puffs into the lungs every 4 (four) hours as needed for shortness of breath. 09/24/19   Paulette Blanch, MD  amLODipine (NORVASC) 5 MG tablet Take 5 mg by mouth daily.    [provider]  aspirin 81 MG tablet Take 81 mg by mouth daily.    [provider]  chlorthalidone (HYGROTON) 25 MG tablet Take 1 tablet (25 mg total) by mouth daily. 07/30/16   Arnetha Courser, MD  ciprofloxacin (CIPRO) 500 MG tablet Take 1  tablet (500 mg total) by mouth 2 (two) times daily for 10 days. 03/12/20 03/22/20  , Charline Bills, PA-C  escitalopram (LEXAPRO) 10 MG tablet Take 10 mg by mouth every evening.    [provider]  fluticasone (FLONASE) 50 MCG/ACT nasal spray Place 2 sprays into both nostrils daily. Patient taking differently: Place 2 sprays into both nostrils at bedtime as needed for allergies.  07/25/16   Arnetha Courser, MD  levothyroxine (SYNTHROID, LEVOTHROID) 75 MCG tablet Take 1 tablet (75 mcg  total) by mouth daily before breakfast. 07/30/16   Lada, Satira Anis, MD  metoprolol succinate (TOPROL-XL) 50 MG 24 hr tablet Take 1 tablet (50 mg total) by mouth daily. Take with or immediately following a meal. 07/30/16   Lada, Satira Anis, MD  metroNIDAZOLE (FLAGYL) 500 MG tablet Take 1 tablet (500 mg total) by mouth 2 (two) times daily for 10 days. 03/12/20 03/22/20  , Charline Bills, PA-C  Nebulizers (COMPRESSOR/NEBULIZER) MISC 1 Units by Does not apply route every 4 (four) hours as needed. 02/28/20   Paulette Blanch, MD  olmesartan (BENICAR) 40 MG tablet Take 40 mg by mouth daily.    [provider]  ondansetron (ZOFRAN-ODT) 4 MG disintegrating tablet Take 1 tablet (4 mg total) by mouth every 8 (eight) hours as needed for nausea or vomiting. 03/12/20   , Charline Bills, PA-C  oxyCODONE (ROXICODONE) 5 MG immediate release tablet Take 1 tablet (5 mg total) by mouth every 4 (four) hours as needed for severe pain. 03/19/18   Hessie Knows, MD  oxyCODONE-acetaminophen (PERCOCET/ROXICET) 5-325 MG tablet Take 1 tablet by mouth every 6 (six) hours as needed for severe pain. 03/12/20   , Charline Bills, PA-C  predniSONE (DELTASONE) 20 MG tablet 3 tablets daily x 4 days 02/28/20   Paulette Blanch, MD  XIGDUO XR 04-999 MG TB24 Take 1 tablet by mouth 2 (two) times daily. 09/19/17   [provider]    Allergies Tussionex pennkinetic er [hydrocod polst-cpm polst er]  Family History  Problem Relation Age of Onset  . Hypertension Mother   . Stroke Father   . Hypertension Father   . Diabetes Father   . Glaucoma Father   . Heart disease Maternal Grandfather   . Stroke Paternal Grandmother   . Diabetes Paternal Grandmother   . Diabetes Paternal Grandfather   . Lung disease Paternal Grandfather   . Cancer Maternal Uncle        lung    Social History Social History   Tobacco Use  . Smoking status: Former Smoker    Quit date: 05/17/2006    Years since quitting: 13.8  . Smokeless  tobacco: Never Used  Substance Use Topics  . Alcohol use: No  . Drug use: No     Review of Systems  Constitutional: No fever/chills Eyes: No visual changes. No discharge ENT: No upper respiratory complaints. Cardiovascular: no chest pain. Respiratory: no cough. No SOB. Gastrointestinal: Sharp left lower quadrant abdominal pain.  No nausea, no vomiting.  First patient experienced diarrhea, now complaining of constipation Genitourinary: Negative for dysuria. No hematuria Musculoskeletal: Negative for musculoskeletal pain. Skin: Negative for rash, abrasions, lacerations, ecchymosis. Neurological: Negative for headaches, focal weakness or numbness. 10-point ROS otherwise negative.  ____________________________________________   PHYSICAL EXAM:  VITAL SIGNS: ED Triage Vitals  Enc Vitals Group     BP 03/12/20 1357 138/88     Pulse Rate 03/12/20 1357 (!) 119     Resp 03/12/20 1357 16  Temp 03/12/20 1357 98.8 F (37.1 C)     Temp Source 03/12/20 1357 Oral     SpO2 03/12/20 1357 96 %     Weight 03/12/20 1358 268 lb 15.4 oz (122 kg)     Height 03/12/20 1358 5\' 9"  (1.753 m)     Head Circumference --      Peak Flow --      Pain Score 03/12/20 1358 0     Pain Loc --      Pain Edu? --      Excl. in [place]? --      Constitutional: Alert and oriented. Well appearing and in no acute distress. Eyes: Conjunctivae are normal. PERRL. EOMI. Head: Atraumatic. ENT:      Ears:       Nose: No congestion/rhinnorhea.      Mouth/Throat: Mucous membranes are moist.  Neck: No stridor.    Cardiovascular: Normal rate, regular rhythm. Normal S1 and S2.  Good peripheral circulation. Respiratory: Normal respiratory effort without tachypnea or retractions. Lungs CTAB. Good air entry to the bases with no decreased or absent breath sounds. Gastrointestinal: Bowel sounds 4 quadrants. Soft and nontender to palpation. No guarding or rigidity. No palpable masses. No distention. No CVA  tenderness. Musculoskeletal: Full range of motion to all extremities. No gross deformities appreciated. Neurologic:  Normal speech and language. No gross focal neurologic deficits are appreciated.  Skin:  Skin is warm, dry and intact. No rash noted. Psychiatric: Mood and affect are normal. Speech and behavior are normal. Patient exhibits appropriate insight and judgement.   ____________________________________________   LABS (all labs ordered are listed, but only abnormal results are displayed)  Labs Reviewed  COMPREHENSIVE METABOLIC PANEL - Abnormal; Notable for the following components:      Result Value   Potassium 3.1 (*)    Chloride 96 (*)    Glucose, Bld 151 (*)    Total Bilirubin 1.6 (*)    All other components within normal limits  CBC - Abnormal; Notable for the following components:   WBC 17.4 (*)    Hemoglobin 17.2 (*)    All other components within normal limits  LIPASE, BLOOD   ____________________________________________  EKG   ____________________________________________  RADIOLOGY I personally viewed and evaluated these images as part of my medical decision making, as well as reviewing the written report by the radiologist.  CT ABDOMEN PELVIS W CONTRAST  Result Date: 03/12/2020 CLINICAL DATA:  Lower abdominal pain for 4 days. Tachycardia. Diverticulitis suspected. History of diverticulitis. EXAM: CT ABDOMEN AND PELVIS WITH CONTRAST TECHNIQUE: Multidetector CT imaging of the abdomen and pelvis was performed using the standard protocol following bolus administration of intravenous contrast. CONTRAST:  157mL OMNIPAQUE IOHEXOL 300 MG/ML  SOLN COMPARISON:  CT 10/20/2017 FINDINGS: Lower chest: The lung bases are clear. Hepatobiliary: Enlarged liver spanning 22.8 cm cranial caudal. Diffusely decreased hepatic density consistent with steatosis. Small subcentimeter hypodensity centrally is likely cyst but too small to characterize. Gallbladder physiologically distended,  no calcified stone. No biliary dilatation. Pancreas: No ductal dilatation or inflammation. Spleen: Upper normal in size spanning 13 cm cranial caudal. No focal abnormality. Adrenals/Urinary Tract: Normal adrenal glands. No hydronephrosis or perinephric edema. Homogeneous renal enhancement with symmetric excretion on delayed phase imaging. Punctate nonobstructing stone in the lower right kidney. Urinary bladder is physiologically distended. Wall thickening about the left lateral aspect of the bladder likely reactive related to adjacent colonic inflammation. Stomach/Bowel: Prominent colonic wall thickening spanning approximately 6.2 cm involving the proximal sigmoid colon  is in the region of multiple diverticula, diverticulitis versus focal colitis. There is adjacent pericolonic edema and soft tissue stranding. Mild adjacent free fluid but no perforation or abscess. Colonic diverticulosis throughout the colon, most prominent distally. Normal appendix. No small bowel obstruction or inflammation. Stomach is unremarkable. Duodenum slightly tortuous. Vascular/Lymphatic: Mild aortic atherosclerosis and tortuosity. The portal vein is patent. No mesenteric venous thrombosis. There multiple prominent retroperitoneal lymph nodes measuring up to 9 mm. Few prominent pericolonic nodes in the region of sigmoid inflammation, including 4 mm node series 2, image 63, and 4 mm node image 62. Reproductive: Prominent prostate gland spanning 5.2 cm. Other: Pericolonic edema and inflammatory changes about the proximal sigmoid colon with minimal adjacent non organized fluid. No free air or abscess. Small fat containing umbilical hernia. Musculoskeletal: There are no acute or suspicious osseous abnormalities. IMPRESSION: 1. Proximal sigmoid colonic wall thickening and adjacent inflammatory change in the region of multiple diverticula. Differential considerations include diverticulitis versus colitis, as inflammatory changes span a length of  6 cm. No perforation or abscess. Recommend follow-up colonoscopy after course of treatment to exclude underlying colonic mass. 2. Multiple prominent retroperitoneal lymph nodes are likely reactive. Few nonspecific pericolonic lymph nodes in the region of sigmoid inflammation. 3. Hepatomegaly and hepatic steatosis. 4. Punctate nonobstructing stone in the lower right kidney. 5. Mildly enlarged prostate gland. Aortic Atherosclerosis (ICD10-I70.0). Electronically Signed   By: Keith Rake M.D.   On: 03/12/2020 17:33    ____________________________________________    PROCEDURES  Procedure(s) performed:    Procedures    Medications  iohexol (OMNIPAQUE) 9 MG/ML oral solution 500 mL (has no administration in time range)  ciprofloxacin (CIPRO) tablet 500 mg (has no administration in time range)  metroNIDAZOLE (FLAGYL) tablet 500 mg (has no administration in time range)  oxyCODONE-acetaminophen (PERCOCET/ROXICET) 5-325 MG per tablet 1 tablet (has no administration in time range)  sodium chloride flush (NS) 0.9 % injection 3 mL (3 mLs Intravenous Given 03/12/20 1648)  sodium chloride 0.9 % bolus 1,000 mL (0 mLs Intravenous Stopped 03/12/20 1638)  fentaNYL (SUBLIMAZE) injection 50 mcg (50 mcg Intravenous Given 03/12/20 1417)  sodium chloride 0.9 % bolus 1,000 mL (1,000 mLs Intravenous New Bag/Given 03/12/20 1651)  morphine 4 MG/ML injection 4 mg (4 mg Intravenous Given 03/12/20 1649)  ondansetron (ZOFRAN) injection 4 mg (4 mg Intravenous Given 03/12/20 1648)  diphenhydrAMINE (BENADRYL) injection 12.5 mg (12.5 mg Intravenous Given 03/12/20 1659)  iohexol (OMNIPAQUE) 300 MG/ML solution 125 mL (125 mLs Intravenous Contrast Given 03/12/20 1716)     ____________________________________________   INITIAL IMPRESSION / ASSESSMENT AND PLAN / ED COURSE  Pertinent labs & imaging results that were available during my care of the patient were reviewed by me and considered in my medical decision making (see  chart for details).  Review of the [place] CSRS was performed in accordance of the [place] prior to dispensing any controlled drugs.           Patient's diagnosis is consistent with diverticulitis.  Patient presented to emergency department complaining of left lower quadrant pain.  History of diverticulitis and this feels similar.  Imaging is concerning for diverticulitis with surrounding colonic irritation.  Per radiologist recommendation, patient should follow-up with colonoscopy.  Patient had a colonoscopy within the last month.  Patient states that at that time they had no concerning findings.  This is likely secondary to diverticulitis versus colonic mass.  Patient will be placed on antibiotics, antiemetics, pain medication.  Follow-up primary care.  Patient continues  to have symptoms, symptoms change either return to emergency department or follow-up with his primary care physician. Patient is given ED precautions to return to the ED for any worsening or new symptoms.     ____________________________________________  FINAL CLINICAL IMPRESSION(S) / ED DIAGNOSES  Final diagnoses:  Diverticulitis      NEW MEDICATIONS STARTED DURING THIS VISIT:  ED Discharge Orders         Ordered    metroNIDAZOLE (FLAGYL) 500 MG tablet  2 times daily     03/12/20 1811    ciprofloxacin (CIPRO) 500 MG tablet  2 times daily     03/12/20 1811    oxyCODONE-acetaminophen (PERCOCET/ROXICET) 5-325 MG tablet  Every 6 hours PRN     03/12/20 1811    ondansetron (ZOFRAN-ODT) 4 MG disintegrating tablet  Every 8 hours PRN     03/12/20 1811              This chart was dictated using voice recognition software/Dragon. Despite best efforts to proofread, errors can occur which can change the meaning. Any change was purely unintentional.    Darletta Moll, PA-C 03/12/20 1846    Lilia Pro., MD 03/13/20 [phone]

## 2020-03-12 NOTE — ED Notes (Signed)
Pt drove self to ER. Pt informed that he will have to call for a ride. Pt called son for ride home. Pt ambulating to bathroom, gait steady.

## 2020-03-12 NOTE — ED Notes (Signed)
Pt states he wears CPap at night

## 2021-06-03 IMAGING — CR DG CHEST 2V
1 series · 2 of 2 positions shown · non-contrast
Comparison: 09/24/2019

CLINICAL DATA: Short of breath.  Chest congestion.

EXAM:
CHEST - 2 VIEW

[Series 1: w chest pa · 0.14mm/px · 2 of 2 slices shown]
[im 1/2]
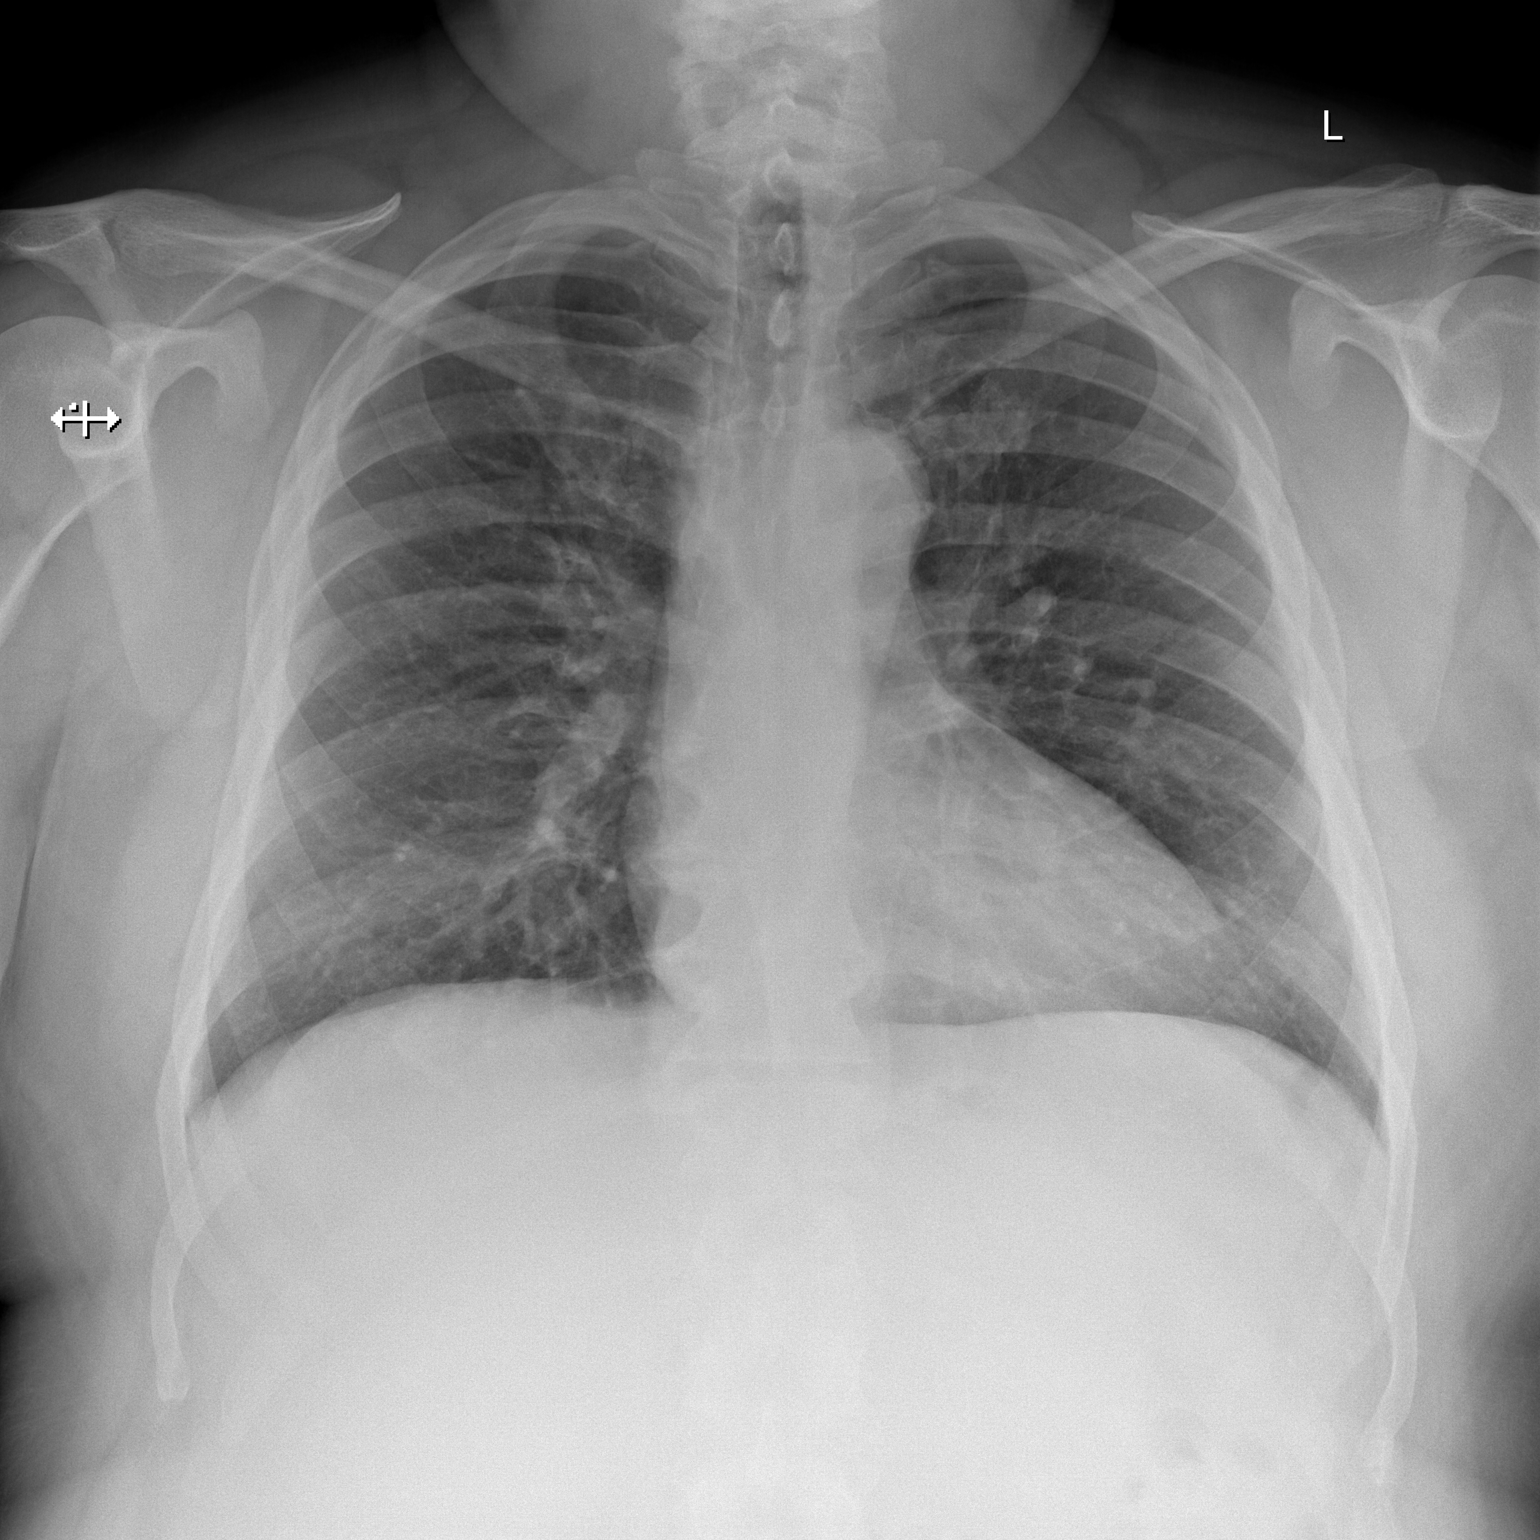
[im 2/2]
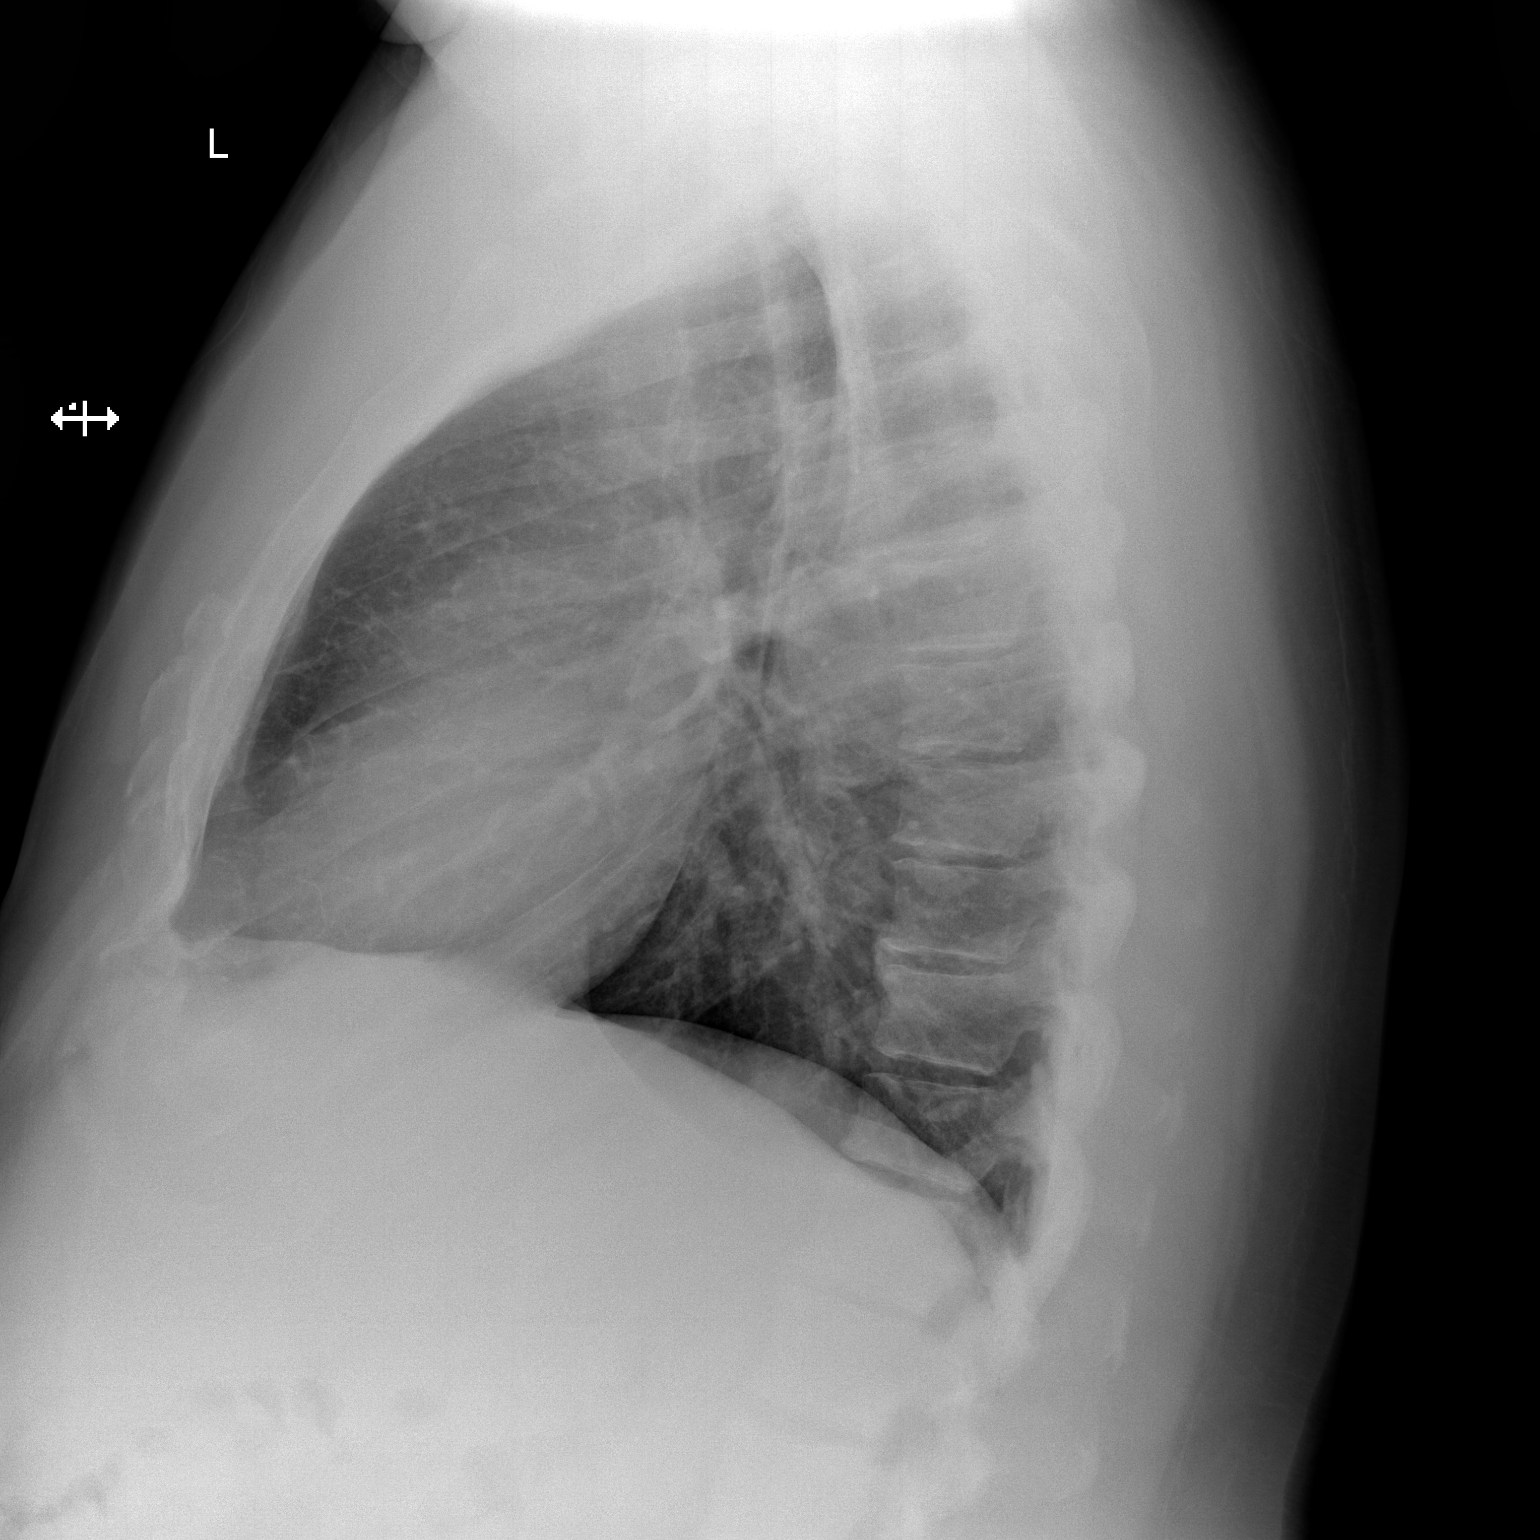

[2 of 2 positions shown; findings below may reference images not displayed]

FINDINGS: Heart size is normal. Mediastinal shadows are normal. The lungs are
clear. No bronchial thickening. No infiltrate, mass, effusion or
collapse. Pulmonary vascularity is normal. No bony abnormality.
IMPRESSION: Normal chest

## 2022-07-01 ENCOUNTER — Emergency Department
Admission: EM | Admit: 2022-07-01 | Discharge: 2022-07-01 | Disposition: A | Discharge status: Home / Self Care | Payer: BC Managed Care – PPO | Attending: Emergency Medicine | Admitting: Emergency Medicine

## 2022-07-01 ENCOUNTER — Encounter: Payer: Self-pay | Admitting: Emergency Medicine

## 2022-07-01 ENCOUNTER — Emergency Department: Payer: BC Managed Care – PPO

## 2022-07-01 ENCOUNTER — Other Ambulatory Visit: Payer: Self-pay

## 2022-07-01 DIAGNOSIS — E119 Type 2 diabetes mellitus without complications: Secondary | ICD-10-CM | POA: Insufficient documentation

## 2022-07-01 DIAGNOSIS — I10 Essential (primary) hypertension: Secondary | ICD-10-CM | POA: Insufficient documentation

## 2022-07-01 DIAGNOSIS — E860 Dehydration: Secondary | ICD-10-CM | POA: Diagnosis not present

## 2022-07-01 DIAGNOSIS — E039 Hypothyroidism, unspecified: Secondary | ICD-10-CM | POA: Insufficient documentation

## 2022-07-01 DIAGNOSIS — R42 Dizziness and giddiness: Secondary | ICD-10-CM | POA: Diagnosis present

## 2022-07-01 LAB — CBC
HCT: 53.4 % — ABNORMAL HIGH (ref 39.0–52.0)
Hemoglobin: 16.9 g/dL (ref 13.0–17.0)
MCH: 25.1 pg — ABNORMAL LOW (ref 26.0–34.0)
MCHC: 31.6 g/dL (ref 30.0–36.0)
MCV: 79.2 fL — ABNORMAL LOW (ref 80.0–100.0)
Platelets: 294 10*3/uL (ref 150–400)
RBC: 6.74 MIL/uL — ABNORMAL HIGH (ref 4.22–5.81)
RDW: 15.3 % (ref 11.5–15.5)
WBC: 8.9 10*3/uL (ref 4.0–10.5)
nRBC: 0 % (ref 0.0–0.2)

## 2022-07-01 LAB — DIFFERENTIAL
Abs Immature Granulocytes: 0.03 10*3/uL (ref 0.00–0.07)
Basophils Absolute: 0.1 10*3/uL (ref 0.0–0.1)
Basophils Relative: 1 %
Eosinophils Absolute: 0.2 10*3/uL (ref 0.0–0.5)
Eosinophils Relative: 2 %
Immature Granulocytes: 0 %
Lymphocytes Relative: 23 %
Lymphs Abs: 2 10*3/uL (ref 0.7–4.0)
Monocytes Absolute: 0.9 10*3/uL (ref 0.1–1.0)
Monocytes Relative: 10 %
Neutro Abs: 5.7 10*3/uL (ref 1.7–7.7)
Neutrophils Relative %: 64 %

## 2022-07-01 LAB — CK: Total CK: 245 U/L (ref 49–397)

## 2022-07-01 LAB — COMPREHENSIVE METABOLIC PANEL
ALT: 18 U/L (ref 0–44)
AST: 23 U/L (ref 15–41)
Albumin: 4.2 g/dL (ref 3.5–5.0)
Alkaline Phosphatase: 56 U/L (ref 38–126)
Anion gap: 6 (ref 5–15)
BUN: 18 mg/dL (ref 6–20)
CO2: 27 mmol/L (ref 22–32)
Calcium: 9.6 mg/dL (ref 8.9–10.3)
Chloride: 103 mmol/L (ref 98–111)
Creatinine, Ser: 0.91 mg/dL (ref 0.61–1.24)
GFR, Estimated: >60 mL/min (ref >60)
Glucose, Bld: 134 mg/dL — ABNORMAL HIGH (ref 70–99)
Potassium: 3.5 mmol/L (ref 3.5–5.1)
Sodium: 136 mmol/L (ref 135–145)
Total Bilirubin: 0.6 mg/dL (ref 0.3–1.2)
Total Protein: 7.8 g/dL (ref 6.5–8.1)

## 2022-07-01 LAB — APTT: aPTT: 38 seconds — ABNORMAL HIGH (ref 24–36)

## 2022-07-01 LAB — ETHANOL: Alcohol, Ethyl (B): <10 mg/dL (ref <10)

## 2022-07-01 LAB — PROTIME-INR
INR: 1.1 (ref 0.8–1.2)
Prothrombin Time: 13.8 seconds (ref 11.4–15.2)

## 2022-07-01 MED ORDER — LACTATED RINGERS IV BOLUS
1000.0000 mL | Freq: Once | INTRAVENOUS | Status: AC
  Administered 2022-07-01: 1000 mL via INTRAVENOUS

## 2022-07-01 NOTE — ED Notes (Signed)
Pt at CT

## 2022-07-01 NOTE — ED Notes (Deleted)
Dr. Blaine Hamper made aware of elevated trop

## 2022-07-01 NOTE — ED Notes (Signed)
See triage note. Pt is A&Ox4. Pt appears to be neurologically intact at this time with NAD noted. Pt does endorse he only drank soda yesterday and no water, pt educated on importance of drinking water in hot humid climates such as yesterday, pt verbalized understanding.

## 2022-07-01 NOTE — ED Notes (Signed)
D/C and reasons to return to ED discussed with pt, pt verbalized understanding. NAD noted on D/C, VSS.

## 2022-07-01 NOTE — ED Triage Notes (Signed)
Pt via POV from home. Pt states he was out doing yard work all yesterday. States that he may have had a heat stroke. Pt c/o blurred vision, L hand numbness, and dizziness when he woke up this morning around 0300 this AM. Denies pain. LKW 11:00pm last night. Pt is A&Ox4 and NAD.

## 2022-07-01 NOTE — ED Provider Notes (Signed)
[facility] Provider Note    Event Date/Time   First MD Initiated Contact with Patient 07/01/22 0825     (approximate)   History   Chief Complaint Dizziness   HPI  Nicholas Cabrera is a 56 y.o. male with past medical history of hypertension, hyperlipidemia, diabetes, and hypothyroidism who presents to the ED complaining of dizziness.  Patient reports that he was working outside in his yard for much of the day yesterday, when temperatures were greater than 90 degrees.  He states he went to bed feeling tired but otherwise felt fine, does admit that he did not drink any water yesterday.  When he woke up this morning, he was feeling very dizzy, like the room was spinning around him but also like he was going to pass out.  He reports feeling very unsteady on his feet, but he denies any vision changes, speech changes, numbness, or weakness.  He did not have any chest pain or shortness of breath with the dizziness this morning.  He feels better at rest but continues to feel slightly dizzy.     Physical Exam   Triage Vital Signs: ED Triage Vitals  Enc Vitals Group     BP 07/01/22 0812 (!) 162/107     Pulse Rate 07/01/22 0812 62     Resp 07/01/22 0812 18     Temp 07/01/22 0812 98 F (36.7 C)     Temp Source 07/01/22 0812 Oral     SpO2 07/01/22 0812 96 %     Weight 07/01/22 0809 259 lb (117.5 kg)     Height 07/01/22 0809 '5\' 9"'$  (1.753 m)     Head Circumference --      Peak Flow --      Pain Score 07/01/22 0809 0     Pain Loc --      Pain Edu? --      Excl. in [place]? --     Most recent vital signs: Vitals:   07/01/22 0812 07/01/22 0900  BP: (!) 162/107 (!) 156/98  Pulse: 62 61  Resp: 18 19  Temp: 98 F (36.7 C)   SpO2: 96% 97%    Constitutional: Alert and oriented. Eyes: Conjunctivae are normal. Head: Atraumatic. Nose: No congestion/rhinnorhea. Mouth/Throat: Mucous membranes are moist.  Cardiovascular: Normal rate, regular rhythm. Grossly normal  heart sounds.  2+ radial pulses bilaterally. Respiratory: Normal respiratory effort.  No retractions. Lungs CTAB. Gastrointestinal: Soft and nontender. No distention. Musculoskeletal: No lower extremity tenderness nor edema.  Neurologic:  Normal speech and language. No gross focal neurologic deficits are appreciated.    ED Results / Procedures / Treatments   Labs (all labs ordered are listed, but only abnormal results are displayed) Labs Reviewed  APTT - Abnormal; Notable for the following components:      Result Value   aPTT 38 (*)    All other components within normal limits  CBC - Abnormal; Notable for the following components:   RBC 6.74 (*)    HCT 53.4 (*)    MCV 79.2 (*)    MCH 25.1 (*)    All other components within normal limits  COMPREHENSIVE METABOLIC PANEL - Abnormal; Notable for the following components:   Glucose, Bld 134 (*)    All other components within normal limits  PROTIME-INR  DIFFERENTIAL  ETHANOL  CK  CBG MONITORING, ED     EKG  ED ECG REPORT I, Blake Divine, the attending physician, personally viewed and interpreted this  ECG.   Date: 07/01/2022  EKG Time: 8:09  Rate: 62  Rhythm: normal sinus rhythm  Axis: Normal  Intervals:none  ST&T Change: None  RADIOLOGY CT head reviewed an interpreted by me with no hemorrhage or midline shift.  PROCEDURES:  Critical Care performed: No  Procedures   MEDICATIONS ORDERED IN ED: Medications  lactated ringers bolus 1,000 mL (1,000 mLs Intravenous Bolus 07/01/22 0903)     IMPRESSION / MDM / ASSESSMENT AND PLAN / ED COURSE  I reviewed the triage vital signs and the nursing notes.                              56 y.o. male with past medical history of hypertension, hyperlipidemia, diabetes, and hypothyroidism who presents to the ED complaining of dizziness and lightheadedness with unsteady gait since trying to get up for work this morning.  Patient's presentation is most consistent with acute  presentation with potential threat to life or bodily function.  Differential diagnosis includes, but is not limited to, stroke, TIA, dehydration, electrolyte abnormality, peripheral vertigo, ACS, arrhythmia, AKI, rhabdomyolysis.  Patient nontoxic-appearing and in no acute distress, vital signs are remarkable only for elevated blood pressure, patient with no focal neurologic deficits on exam.  Symptoms seem less concerning for stroke and CT head is negative for acute process.  Symptoms seem more consistent with dehydration, heat exposure, or rhabdomyolysis.  Labs thus far are reassuring with no electrolyte abnormality or AKI, no significant anemia or leukocytosis but patient does appear slightly hemoconcentrated consistent with dehydration.  LFTs are within normal limits.  We will add on CK level and hydrate with IV fluids.  CK level within normal limits and patient feeling better following IV fluid bolus.  He is appropriate for discharge home with PCP follow-up, was counseled to drink plenty of water and to return to the ED for new or worsening symptoms.  Patient agrees with plan.      FINAL CLINICAL IMPRESSION(S) / ED DIAGNOSES   Final diagnoses:  Dizziness  Dehydration     Rx / DC Orders   ED Discharge Orders     None        Note:  This document was prepared using Dragon voice recognition software and may include unintentional dictation errors.   Blake Divine, MD 07/01/22 [phone]

## 2024-12-01 ENCOUNTER — Emergency Department
Admission: EM | Admit: 2024-12-01 | Discharge: 2024-12-01 | Disposition: A | Discharge status: Home / Self Care | Attending: Emergency Medicine | Admitting: Emergency Medicine

## 2024-12-01 ENCOUNTER — Other Ambulatory Visit: Payer: Self-pay

## 2024-12-01 ENCOUNTER — Emergency Department

## 2024-12-01 DIAGNOSIS — E119 Type 2 diabetes mellitus without complications: Secondary | ICD-10-CM | POA: Diagnosis not present

## 2024-12-01 DIAGNOSIS — G510 Bell's palsy: Secondary | ICD-10-CM | POA: Insufficient documentation

## 2024-12-01 LAB — CBC
HCT: 51.7 % (ref 39.0–52.0)
Hemoglobin: 17.5 g/dL — ABNORMAL HIGH (ref 13.0–17.0)
MCH: 28.1 pg (ref 26.0–34.0)
MCHC: 33.8 g/dL (ref 30.0–36.0)
MCV: 83.1 fL (ref 80.0–100.0)
Platelets: 271 K/uL (ref 150–400)
RBC: 6.22 MIL/uL — ABNORMAL HIGH (ref 4.22–5.81)
RDW: 12.6 % (ref 11.5–15.5)
WBC: 9.5 K/uL (ref 4.0–10.5)
nRBC: 0 % (ref 0.0–0.2)

## 2024-12-01 LAB — COMPREHENSIVE METABOLIC PANEL WITH GFR
ALT: 16 U/L (ref 0–44)
AST: 19 U/L (ref 15–41)
Albumin: 4.6 g/dL (ref 3.5–5.0)
Alkaline Phosphatase: 75 U/L (ref 38–126)
Anion gap: 11 (ref 5–15)
BUN: 12 mg/dL (ref 6–20)
CO2: 28 mmol/L (ref 22–32)
Calcium: 9.8 mg/dL (ref 8.9–10.3)
Chloride: 98 mmol/L (ref 98–111)
Creatinine, Ser: 0.75 mg/dL (ref 0.61–1.24)
GFR, Estimated: >60 mL/min (ref >60)
Glucose, Bld: 127 mg/dL — ABNORMAL HIGH (ref 70–99)
Potassium: 3.3 mmol/L — ABNORMAL LOW (ref 3.5–5.1)
Sodium: 137 mmol/L (ref 135–145)
Total Bilirubin: 0.6 mg/dL (ref 0.0–1.2)
Total Protein: 7.7 g/dL (ref 6.5–8.1)

## 2024-12-01 LAB — DIFFERENTIAL
Abs Immature Granulocytes: 0.03 K/uL (ref 0.00–0.07)
Basophils Absolute: 0.1 K/uL (ref 0.0–0.1)
Basophils Relative: 1 %
Eosinophils Absolute: 0.3 K/uL (ref 0.0–0.5)
Eosinophils Relative: 3 %
Immature Granulocytes: 0 %
Lymphocytes Relative: 16 %
Lymphs Abs: 1.5 K/uL (ref 0.7–4.0)
Monocytes Absolute: 0.8 K/uL (ref 0.1–1.0)
Monocytes Relative: 8 %
Neutro Abs: 6.9 K/uL (ref 1.7–7.7)
Neutrophils Relative %: 72 %

## 2024-12-01 LAB — APTT: aPTT: 38 s — ABNORMAL HIGH (ref 24–36)

## 2024-12-01 LAB — TROPONIN T, HIGH SENSITIVITY: Troponin T High Sensitivity: <15 ng/L (ref 0–19)

## 2024-12-01 LAB — ETHANOL: Alcohol, Ethyl (B): <15 mg/dL (ref <15)

## 2024-12-01 LAB — PROTIME-INR
INR: 0.9 (ref 0.8–1.2)
Prothrombin Time: 13.1 s (ref 11.4–15.2)

## 2024-12-01 MED ORDER — GADOBUTROL 1 MMOL/ML IV SOLN
10.0000 mL | Freq: Once | INTRAVENOUS | Status: AC | PRN
  Administered 2024-12-01: 10 mL via INTRAVENOUS

## 2024-12-01 MED ORDER — ERYTHROMYCIN 5 MG/GM OP OINT: 1.0000 | TOPICAL_OINTMENT | Freq: Every day | OPHTHALMIC | 0 refills | Status: AC

## 2024-12-01 MED ORDER — FLUORESCEIN SODIUM 1 MG OP STRP
1.0000 | ORAL_STRIP | Freq: Once | OPHTHALMIC | Status: AC
  Administered 2024-12-01: 1 via OPHTHALMIC
  Filled 2024-12-01: qty 1

## 2024-12-01 MED ORDER — VALACYCLOVIR HCL 1 G PO TABS: 1000.0000 mg | ORAL_TABLET | Freq: Three times a day (TID) | ORAL | 0 refills | Status: AC

## 2024-12-01 MED ORDER — PREDNISONE 20 MG PO TABS: 60.0000 mg | ORAL_TABLET | Freq: Every day | ORAL | 0 refills | Status: AC

## 2024-12-01 NOTE — ED Triage Notes (Signed)
 Pt to ED for right sided facial droop, altered balance, slurred speech noticed at 0200 this am. LKW 2230 when went to bed last night. Reports having trouble eating last night.

## 2024-12-01 NOTE — ED Notes (Signed)
 CCMD called to place pt on cardiac monitoring.

## 2024-12-01 NOTE — ED Notes (Signed)
 Pt in MRI at this time

## 2024-12-01 NOTE — ED Notes (Signed)
 RN spoke with Lilly, pt's sister and gave pt update.

## 2024-12-01 NOTE — ED Provider Notes (Signed)
 [facility] Provider Note    Event Date/Time   First MD Initiated Contact with Patient 12/01/24 1040     (approximate)   History   Facial Droop   HPI  Nicholas Cabrera is a 58 y.o. male with history of diabetes with last known well of 12/1.   Patient reports that when he woke up on 12/2 he had a little bit of blurred vision and tearing noted to his right eye.  Later that night he noticed some difficulties with swallowing with food getting stuck on the right side of his face around 8 PM.  He then went to bed and woke up this morning with facial droop.  He did report some difficulties with ambulation but does report some chronic issues with this as well.    Patient denies any concerns for tick bites.  He denies any history of stroke.    Physical Exam   Triage Vital Signs: ED Triage Vitals  Encounter Vitals Group     BP 12/01/24 1030 (!) 182/105     Girls Systolic BP Percentile --      Girls Diastolic BP Percentile --      Boys Systolic BP Percentile --      Boys Diastolic BP Percentile --      Pulse Rate 12/01/24 1030 77     Resp 12/01/24 1030 20     Temp 12/01/24 1030 98 F (36.7 C)     Temp src --      SpO2 12/01/24 1030 98 %     Weight 12/01/24 1029 250 lb (113.4 kg)     Height 12/01/24 1029 5' 9 (1.753 m)     Head Circumference --      Peak Flow --      Pain Score 12/01/24 1029 0     Pain Loc --      Pain Education --      Exclude from Growth Chart --     Most recent vital signs: Vitals:   12/01/24 1030  BP: (!) 182/105  Pulse: 77  Resp: 20  Temp: 98 F (36.7 C)  SpO2: 98%     General: Awake, no distress.  CV:  Good peripheral perfusion.  Resp:  Normal effort.  Abd:  No distention.  Soft and nontender Other:  Facial droop noted on the right with difficulties lifting up the right eyebrow fully.  Equal strength in arms and legs.  Sensation intact.   ED Results / Procedures / Treatments   Labs (all labs ordered are  listed, but only abnormal results are displayed) Labs Reviewed  APTT - Abnormal; Notable for the following components:      Result Value   aPTT 38 (*)    All other components within normal limits  CBC - Abnormal; Notable for the following components:   RBC 6.22 (*)    Hemoglobin 17.5 (*)    All other components within normal limits  COMPREHENSIVE METABOLIC PANEL WITH GFR - Abnormal; Notable for the following components:   Potassium 3.3 (*)    Glucose, Bld 127 (*)    All other components within normal limits  PROTIME-INR  DIFFERENTIAL  ETHANOL  CBG MONITORING, ED     EKG  My interpretation of EKG:  Normal sinus rate of 80 without any ST elevation or T wave versions, normal intervals  RADIOLOGY I have reviewed the ct personally and interpreted possible hypodensity on the left no obvious bleed   PROCEDURES:  Critical Care performed: No  Procedures   MEDICATIONS ORDERED IN ED: Medications  fluorescein ophthalmic strip 1 strip (has no administration in time range)  gadobutrol (GADAVIST) 1 MMOL/ML injection 10 mL (10 mLs Intravenous Contrast Given 12/01/24 1303)     IMPRESSION / MDM / ASSESSMENT AND PLAN / ED COURSE  I reviewed the triage vital signs and the nursing notes.   Patient's presentation is most consistent with acute presentation with potential threat to life or bodily function.   Patient had initially blurred vision yesterday with a last known well of 12/1 therefore out of the window for LVO and does not seem like LVO stroke.  His last known well if we based upon the facial droop and slurred speech was at 2230 before going to bed so was out of the window for TNK therefore stroke code was not called.  Exam seems concerning more for potential Bell's palsy.  He denies any tick bites.  The difficulties with speech sounds more likely just related to the facial droop but I will proceed with MRI given the concerns from the CT imaging without evidence of intracranial  hemorrhage but potential the a artifact versus stroke as well as from the fact the patient did report some difficulties with ambulating as well.  If this is negative and do suspect this is more likely Bell's palsy.  Troponin negative.  Coags reassuring.  CBC shows normal white count.  CMP reassuring  IMPRESSION: 1. Apparent hypodensity in the left pons which could represent age-indeterminant infarct or artifact. Finding is new since July 01, 2022. Recommend MRI head.  INR normal CBC normal white count CMP reassuring troponin negative  MPRESSION: 1. Abnormal, non-masslike enhancement of the right facial nerve compatible with Bells palsy in the setting of right facial droop. 2. No acute infarct. 3. Mildly to moderately age-advanced chronic small vessel ischemic disease.  Patient has no evidence of uptake on fluorescein stain.  However we did discuss erythromycin ointment at nighttime, artificial eyedrops during the day and calling [facility] for follow-up appointment.  He also has no evidence of lesions inside of his ears or lesions on his nose or ear.   Discussed with patient Bell's palsy and need for follow-up outpatient with [facility] as well as neurology we discussed course of treatment for this, taping eye shut and he expressed understanding felt comfortable with discharge home.  The patient is on the cardiac monitor to evaluate for evidence of arrhythmia and/or significant heart rate changes.      FINAL CLINICAL IMPRESSION(S) / ED DIAGNOSES   Final diagnoses:  Bell's palsy     Rx / DC Orders   ED Discharge Orders          Ordered    predniSONE  (DELTASONE ) 20 MG tablet  Daily with breakfast        12/01/24 1332    valACYclovir (VALTREX) 1000 MG tablet  3 times daily        12/01/24 1332    erythromycin ophthalmic ointment  Daily at bedtime        12/01/24 1332             Note:  This document was prepared using Dragon voice recognition software and may  include unintentional dictation errors.   Ernest Ronal BRAVO, MD 12/01/24 1336

## 2024-12-01 NOTE — Discharge Instructions (Addendum)
 Take the steroids to help with inflammation use the valacyclovir to help with the symptoms as well.  Use artificial eyedrops every hour while awake and use erythromycin ointment at nighttime you can try to tape your eye closed with ophthalmic tape we can get over at [facility].  Please call [facility] to make a follow-up appointment for your eye to be reevaluated tomorrow or Friday.  Please call neurology to make a follow-up appointment.  MPRESSION: 1. Abnormal, non-masslike enhancement of the right facial nerve compatible with Bells palsy in the setting of right facial droop. 2. No acute infarct. 3. Mildly to moderately age-advanced chronic small vessel ischemic disease.
